# Patient Record
Sex: Female | Born: 1970
Health system: Southern US, Community
[De-identification: ages and names within clinical notes are randomized; demographics above are authoritative.]

## PROBLEM LIST (undated history)

## (undated) ENCOUNTER — Inpatient Hospital Stay (HOSPITAL_COMMUNITY): Payer: Self-pay

## (undated) DIAGNOSIS — K219 Gastro-esophageal reflux disease without esophagitis: Secondary | ICD-10-CM

## (undated) DIAGNOSIS — E669 Obesity, unspecified: Secondary | ICD-10-CM

## (undated) DIAGNOSIS — K649 Unspecified hemorrhoids: Secondary | ICD-10-CM

## (undated) DIAGNOSIS — I10 Essential (primary) hypertension: Secondary | ICD-10-CM

## (undated) DIAGNOSIS — L910 Hypertrophic scar: Secondary | ICD-10-CM

## (undated) DIAGNOSIS — A599 Trichomoniasis, unspecified: Secondary | ICD-10-CM

## (undated) DIAGNOSIS — IMO0001 Reserved for inherently not codable concepts without codable children: Secondary | ICD-10-CM

## (undated) HISTORY — PX: KELOID EXCISION: SHX1856

## (undated) HISTORY — DX: Reserved for inherently not codable concepts without codable children: IMO0001

## (undated) HISTORY — DX: Gastro-esophageal reflux disease without esophagitis: K21.9

## (undated) HISTORY — DX: Essential (primary) hypertension: I10

## (undated) HISTORY — DX: Hypertrophic scar: L91.0

## (undated) HISTORY — DX: Trichomoniasis, unspecified: A59.9

## (undated) HISTORY — DX: Unspecified hemorrhoids: K64.9

## (undated) HISTORY — DX: Obesity, unspecified: E66.9

---

## 2007-05-20 ENCOUNTER — Ambulatory Visit: Payer: Self-pay | Admitting: Obstetrics and Gynecology

## 2007-08-19 ENCOUNTER — Emergency Department (HOSPITAL_COMMUNITY): Admission: EM | Admit: 2007-08-19 | Discharge: 2007-08-19 | Payer: Self-pay | Admitting: Emergency Medicine

## 2007-08-28 ENCOUNTER — Emergency Department: Payer: Self-pay | Admitting: Emergency Medicine

## 2008-11-15 ENCOUNTER — Encounter: Payer: Self-pay | Admitting: Family Medicine

## 2008-11-15 ENCOUNTER — Ambulatory Visit: Payer: Self-pay | Admitting: Family Medicine

## 2008-11-22 ENCOUNTER — Ambulatory Visit: Payer: Self-pay | Admitting: Obstetrics & Gynecology

## 2009-12-05 ENCOUNTER — Ambulatory Visit: Payer: Self-pay | Admitting: Obstetrics and Gynecology

## 2010-04-26 ENCOUNTER — Ambulatory Visit (INDEPENDENT_AMBULATORY_CARE_PROVIDER_SITE_OTHER): Payer: 59 | Admitting: Obstetrics & Gynecology

## 2010-04-26 DIAGNOSIS — IMO0001 Reserved for inherently not codable concepts without codable children: Secondary | ICD-10-CM

## 2010-04-26 DIAGNOSIS — Z30430 Encounter for insertion of intrauterine contraceptive device: Secondary | ICD-10-CM

## 2010-04-26 HISTORY — DX: Reserved for inherently not codable concepts without codable children: IMO0001

## 2010-05-01 ENCOUNTER — Other Ambulatory Visit: Payer: Self-pay | Admitting: Family Medicine

## 2010-05-01 ENCOUNTER — Encounter: Payer: Self-pay | Admitting: Family Medicine

## 2010-05-01 ENCOUNTER — Ambulatory Visit (INDEPENDENT_AMBULATORY_CARE_PROVIDER_SITE_OTHER): Payer: 59 | Admitting: Family Medicine

## 2010-05-01 DIAGNOSIS — I10 Essential (primary) hypertension: Secondary | ICD-10-CM

## 2010-05-01 DIAGNOSIS — K219 Gastro-esophageal reflux disease without esophagitis: Secondary | ICD-10-CM | POA: Insufficient documentation

## 2010-05-01 LAB — CBC WITH DIFFERENTIAL/PLATELET
Basophils Relative: 0.3 % (ref 0.0–3.0)
Eosinophils Absolute: 0.3 10*3/uL (ref 0.0–0.7)
Eosinophils Relative: 3 % (ref 0.0–5.0)
HCT: 41.1 % (ref 36.0–46.0)
Hemoglobin: 13.5 g/dL (ref 12.0–15.0)
Lymphs Abs: 2.4 10*3/uL (ref 0.7–4.0)
MCHC: 32.9 g/dL (ref 30.0–36.0)
MCV: 72.2 fl — ABNORMAL LOW (ref 78.0–100.0)
Monocytes Absolute: 0.6 10*3/uL (ref 0.1–1.0)
Neutro Abs: 5.2 10*3/uL (ref 1.4–7.7)
Neutrophils Relative %: 61.6 % (ref 43.0–77.0)
RBC: 5.7 Mil/uL — ABNORMAL HIGH (ref 3.87–5.11)
WBC: 8.5 10*3/uL (ref 4.5–10.5)

## 2010-05-01 LAB — BASIC METABOLIC PANEL
CO2: 28 mEq/L (ref 19–32)
Calcium: 9.3 mg/dL (ref 8.4–10.5)
Chloride: 105 mEq/L (ref 96–112)
Glucose, Bld: 137 mg/dL — ABNORMAL HIGH (ref 70–99)
Potassium: 5 mEq/L (ref 3.5–5.1)
Sodium: 140 mEq/L (ref 135–145)

## 2010-05-09 NOTE — Assessment & Plan Note (Signed)
Summary: new patient/[facility] recommend/rb...   Vital Signs:  Patient profile:   40 year old female Height:      60 inches Weight:      203.50 pounds BMI:     39.89 Temp:     97.9 degrees F oral BP sitting:   150 / 110  (left arm) Cuff size:   large  Vitals Entered By: Linde Gillis CMA Duncan Dull) (May 01, 2010 2:02 PM) CC: new patient, establish care   History of Present Illness: 40 yo here to establish care.  HTN- has been told it is elevated for years. At [facility] on 04/26/2010, BP was 158/112.  150/110 in our office today. Never been treated for HTN. Strong FH of HTN. Admits to eating foods very high in sodium. Does want to loose weight.  Has a constant frontal headache. Sometimes has blurred vision, had a dilated eye exam last year.  No CP, SOB, or LE edema.  At times does have problems with acid reflux, can feel acid/food coming up when she is laying down.  Admits to smoking and eating high fat foods.  Preventive Screening-Counseling & Management  Alcohol-Tobacco     Smoking Status: current  Current Medications (verified): 1)  Quinapril Hcl 10 Mg  Tabs (Quinapril Hcl) .Marland Kitchen.. 1 By Mouth Daily 2)  Omeprazole 20 Mg Cpdr (Omeprazole) .... One By Mouth Daily  Allergies (verified): No Known Drug Allergies  Past History:  Family History: Last updated: 05/01/2010 Family History Hypertension  Social History: Last updated: 05/01/2010 Works as Energy manager Current Smoker Alcohol use-no  Risk Factors: Smoking Status: current (05/01/2010)  Past Medical History: Hypertension G3P2 IUD placed 04/26/2010 GERD  Past Surgical History: Denies surgical history  Past History:  Care Management: OB/Gyn: [facility]  Family History: Family History Hypertension  Social History: Works as Energy manager Current Smoker Alcohol use-no Smoking Status:  current  Review of Systems      See  HPI General:  Denies malaise. Eyes:  Complains of blurring. ENT:  Denies difficulty swallowing. CV:  Denies chest pain or discomfort. Resp:  Denies shortness of breath. Neuro:  Complains of headaches; denies tremors, visual disturbances, and weakness.  Physical Exam  General:  alert, well-developed, and overweight-appearing.   Head:  normocephalic, atraumatic, no abnormalities observed, and no abnormalities palpated.   Eyes:  vision grossly intact, pupils equal, pupils round, and pupils reactive to light.   Ears:  R ear normal and L ear normal.   Nose:  no external deformity.   Mouth:  good dentition.   Neck:  No deformities, masses, or tenderness noted. Lungs:  Normal respiratory effort, chest expands symmetrically. Lungs are clear to auscultation, no crackles or wheezes. Heart:  Normal rate and regular rhythm. S1 and S2 normal without gallop, murmur, click, rub or other extra sounds. Extremities:  no edema Neurologic:  alert & oriented X3 and gait normal.   Skin:  Intact without suspicious lesions or rashes Cervical Nodes:  No lymphadenopathy noted Psych:  Cognition and judgment appear intact. Alert and cooperative with normal attention span and concentration. No apparent delusions, illusions, hallucinations   Impression & Recommendations:  Problem # 1:  HYPERTENSION (ICD-401.9) Assessment New Discussed decreasing sodium in diet and increasing physical activity.   Will start Quinipril 10 mg daily, pt information handout for Quinipril given. Check BMET, TSH and CBC today. FOllow up in 1 month.  If remains high, consider renal ultrasound. Her  updated medication list for this problem includes:    Quinapril Hcl 10 Mg Tabs (Quinapril hcl) .Marland Kitchen... 1 by mouth daily  Orders: Venipuncture (04540) TLB-BMP (Basic Metabolic Panel-BMET) (80048-METABOL) TLB-TSH (Thyroid Stimulating Hormone) (84443-TSH) TLB-CBC Platelet - w/Differential (85025-CBCD)  Problem # 2:  GERD  (ICD-530.81) Assessment: New Discussed lifestyle modification- not eating late, cutting back on high fat foods and smoking cessation. Given Rx for Omeprazole. Her updated medication list for this problem includes:    Omeprazole 20 Mg Cpdr (Omeprazole) ..... One by mouth daily  Complete Medication List: 1)  Quinapril Hcl 10 Mg Tabs (Quinapril hcl) .Marland Kitchen.. 1 by mouth daily 2)  Omeprazole 20 Mg Cpdr (Omeprazole) .... One by mouth daily  Patient Instructions: 1)  Great to meet you. 2)  please make a follow up appointment in one month. Prescriptions: OMEPRAZOLE 20 MG CPDR (OMEPRAZOLE) one by mouth daily  #90 x 3   Entered and Authorized by:   Ruthe Mannan MD   Signed by:   Ruthe Mannan MD on 05/01/2010   Method used:   Print then Give to Patient   RxID:   [id number] QUINAPRIL HCL 10 MG  TABS (QUINAPRIL HCL) 1 by mouth daily  #90 x 1   Entered and Authorized by:   Ruthe Mannan MD   Signed by:   Ruthe Mannan MD on 05/01/2010   Method used:   Print then Give to Patient   RxID:   [id number]    Orders Added: 1)  Venipuncture [13244] 2)  TLB-BMP (Basic Metabolic Panel-BMET) [80048-METABOL] 3)  TLB-TSH (Thyroid Stimulating Hormone) [84443-TSH] 4)  TLB-CBC Platelet - w/Differential [85025-CBCD] 5)  New Patient Level III [99203]    Prior Medications (reviewed today): None Current Allergies (reviewed today): No known allergies      Past Medical History:    Hypertension    G3P2    IUD placed 04/26/2010    GERD  Past Surgical History:    Denies surgical history

## 2010-05-30 ENCOUNTER — Ambulatory Visit: Payer: 59 | Admitting: Obstetrics and Gynecology

## 2010-06-21 ENCOUNTER — Ambulatory Visit: Payer: 59 | Admitting: Obstetrics & Gynecology

## 2010-07-05 ENCOUNTER — Ambulatory Visit: Payer: 59 | Admitting: Family Medicine

## 2010-07-11 NOTE — Assessment & Plan Note (Signed)
Brittany Grant, PARISH NO.:  [id number]   MEDICAL RECORD NO.:  [id number]          PATIENT TYPE:  POB   LOCATION:  [facility]         FACILITY:  [facility]   PHYSICIAN:  Argentina Donovan, MD        DATE OF BIRTH:  02-04-1971   DATE OF SERVICE:  11/22/2008                                  CLINIC NOTE   The patient is a 40 year old gravida 3, para 2-0-1-2 who was in recently  for her routine exam on November 16, 2008, decided to have her IUD  removed as she wanted to conceive.  She came in today and we easily  removed her Mirena without incident.  Also discussed with her that she  wait 2 cycles to try and conceive and that she start immediately on  prenatal vitamins with folic acid.  In addition to this, while removing  it we noticed that the patient had a 4 x 3 x 3 pedunculated lipoma on  her right buttocks in the anterior aspect, pendulous, and certainly  bothersome.  She has had to get that removed because of formation of  keloids that she has had in the past.  I have told her that they could  prevent the keloids and that she should have it removed, that she  thought it was a keloid and I told her it was probably a lipoma and it  is a tumor.  We are referring her to a dermatologist, specifically  because they tend to have all the instruments for office removal,  although the base does measure about 4 cm x 0.5 cm in length and width  and will probably require several stitches afterwards.   Impression is successful removal of the intrauterine device and refer to  dermatologist for lipoma removal.           ______________________________  Argentina Donovan, MD     PR/MEDQ  D:  11/22/2008  T:  11/23/2008  Job:  [id number]

## 2010-07-11 NOTE — Assessment & Plan Note (Signed)
Brittany Grant, Brittany Grant NO.:  [id number]   MEDICAL RECORD NO.:  [id number]          PATIENT TYPE:  POB   LOCATION:  [facility]         FACILITY:  [facility]   PHYSICIAN:  Tinnie Gens, MD        DATE OF BIRTH:  1970/10/01   DATE OF SERVICE:                                  CLINIC NOTE   HISTORY OF PRESENT ILLNESS:  The patient is a 40 year old gravida 3,  para 2-0-1-2 who comes in today for an annual exam.  She is without  significant complaint.  She has a Mirena IUD in for the last several  years and has not had a cycle since that was put in.  Before that she  had a copper T IUD which gave her very heavy cycles.  She is interested  in having another baby and she wants to have a girl if possible.  The  patient reports a remote history of hypertension diagnosed at some  medical clinic; however, she is on no medication and blood pressure is  fine today.   PAST MEDICAL HISTORY:  Significant for hypertension.   PAST SURGICAL HISTORY:  She has had keloids removed from her ears.   FAMILY HISTORY:  Diabetes and hypertension.   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  She is a smoker, approximately quarter pack per day  for the last 10 years.  No alcohol.  No other drug use.   OBSTETRICAL HISTORY:  G3, P2, two vaginal deliveries and one  termination.   GYNECOLOGIC HISTORY:  Menarche at age 47.  Cycles were monthly.  She has  a Mirena IUD, which means no cycle for her.  She does have history of  abnormal Pap in 2001.  Her last Pap was in 2008.   REVIEW OF SYSTEMS:  A 14-point review of systems is reviewed.  Please  see GYN history in the chart, which is positive for fatigue, headache,  and vaginal odor.   PHYSICAL EXAMINATION:  GENERAL:  Today, the patient is an obese female  in no acute distress.  VITALS:  Blood pressures 92/66 and pulse is 51, weight is 207.  HEENT:  Normocephalic, atraumatic.  Sclerae  anicteric.  NECK:  Supple.  Normal thyroid.  LUNGS:  Clear bilaterally.  CV:  Regular rate and rhythm without rubs, gallops, murmurs.  ABDOMEN:  Soft, nontender, nondistended.  SKIN:  Multiple keloid formations on it, most notably on her back, she  has some on her chest, and one on her buttocks.  BREASTS:  Symmetric with everted nipples.  No definitive mass.  She has  bilateral fibrocystic change noted.  No supraclavicular or axillary  adenopathy.  GU:  Normal external female genitalia.  BUS is normal.  Vagina is pink  and rugated.  Cervix is parous without lesions.  IUD strings are  visualized.  Uterus is small, anteverted.  No adnexal mass or  tenderness, although exam is limited by body habitus.  EXTREMITIES:  No cyanosis, clubbing, or edema.   IMPRESSION:  1. Yearly exam with Pap smear.  2. Interested in IUD removal, we will perform next week.  3. Interested in  pregnancy, should not start prenatal vitamins with      folic acid once she gets her IUD taken out.  4. I see no evidence of hypertension today.           ______________________________  Tinnie Gens, MD     TP/MEDQ  D:  11/15/2008  T:  11/16/2008  Job:  [id number]

## 2010-09-06 ENCOUNTER — Other Ambulatory Visit (HOSPITAL_COMMUNITY)
Admission: RE | Admit: 2010-09-06 | Discharge: 2010-09-06 | Disposition: A | Discharge status: Home / Self Care | Payer: BC Managed Care – PPO | Source: Ambulatory Visit | Attending: Family Medicine | Admitting: Family Medicine

## 2010-09-06 ENCOUNTER — Encounter: Payer: Self-pay | Admitting: Family Medicine

## 2010-09-06 ENCOUNTER — Ambulatory Visit (INDEPENDENT_AMBULATORY_CARE_PROVIDER_SITE_OTHER): Payer: BC Managed Care – PPO | Admitting: Family Medicine

## 2010-09-06 ENCOUNTER — Ambulatory Visit (INDEPENDENT_AMBULATORY_CARE_PROVIDER_SITE_OTHER): Payer: 59 | Admitting: Family Medicine

## 2010-09-06 VITALS — BP 160/100 | Temp 97.8°F | Wt 199.2 lb

## 2010-09-06 VITALS — BP 157/123 | HR 75 | Ht 60.0 in | Wt 201.0 lb

## 2010-09-06 DIAGNOSIS — I10 Essential (primary) hypertension: Secondary | ICD-10-CM

## 2010-09-06 DIAGNOSIS — Z01419 Encounter for gynecological examination (general) (routine) without abnormal findings: Secondary | ICD-10-CM | POA: Insufficient documentation

## 2010-09-06 MED ORDER — LISINOPRIL-HYDROCHLOROTHIAZIDE 20-12.5 MG PO TABS: 1.0000 | ORAL_TABLET | Freq: Every day | ORAL | Status: DC

## 2010-09-06 NOTE — Progress Notes (Signed)
40 yo here to follow HTN.  HTN-   Started her on Quinipril 10 mg daily in March, pt lost to follow up. Was at [facility] office recently and it was elevated again in 160s/100. Elevated here again today. Says she is taking her medication regularly. No noticeable side effects from the medication.  She is having headaches lately and "fuzzy vision." No muscle weakness or other focal neurological deficits.   Strong FH of HTN. Admits to eating foods very high in sodium. Trying to loose weight.  Wt Readings from Last 3 Encounters:  09/06/10 199 lb 4 oz (90.379 kg)  09/06/10 201 lb (91.173 kg)  05/01/10 203 lb 8 oz (92.307 kg)      Patient Active Problem List  Diagnoses  . HYPERTENSION  . GERD   Past Medical History  Diagnosis Date  . Hypertension   . GERD (gastroesophageal reflux disease)   . IUD 04/26/2010  . Obesity   . Scarring, keloid    No past surgical history on file. History  Substance Use Topics  . Smoking status: Current Everyday Smoker -- 0.2 packs/day for 10 years    Types: Cigarettes  . Smokeless tobacco: Never Used   Comment: smoking 1/4 pack per day  . Alcohol Use: No   Family History  Problem Relation Age of Onset  . Hypertension Other   . Diabetes Mother   . Diabetes Father   . Hypertension Mother   . Hypertension Father   . Hypertension Father    No Known Allergies Current Outpatient Prescriptions on File Prior to Visit  Medication Sig Dispense Refill  . levonorgestrel (MIRENA) 20 MCG/24HR IUD 1 each by Intrauterine route once.          The PMH, PSH, Social History, Family History, Medications, and allergies have been reviewed in [facility], and have been updated if relevant.  Review of Systems       See HPI General:  Denies malaise. Eyes:  Complains of blurring. ENT:  Denies difficulty swallowing. CV:  Denies chest pain or discomfort. Resp:  Denies shortness of breath. Neuro:  Complains of headaches; denies tremors, visual disturbances, and  weakness.  Physical Exam BP 160/100  Temp(Src) 97.8 F (36.6 C) (Oral)  Wt 199 lb 4 oz (90.379 kg)  General:  alert, well-developed, and overweight-appearing.   Head:  normocephalic, atraumatic, no abnormalities observed, and no abnormalities palpated.   Eyes:  vision grossly intact, pupils equal, pupils round, and pupils reactive to light.   Ears:  R ear normal and L ear normal.   Nose:  no external deformity.   Mouth:  good dentition.   Neck:  No deformities, masses, or tenderness noted. Lungs:  Normal respiratory effort, chest expands symmetrically. Lungs are clear to auscultation, no crackles or wheezes. Heart:  Normal rate and regular rhythm. S1 and S2 normal without gallop, murmur, click, rub or other extra sounds. Extremities:  no edema Neurologic:  alert & oriented X3 and gait normal.   Skin:  Intact without suspicious lesions or rashes Cervical Nodes:  No lymphadenopathy noted Psych:  Cognition and judgment appear intact. Alert and cooperative with normal attention span and concentration. No apparent delusions, illusions, hallucinations   Impression & Recommendations: 1. HYPERTENSION    Deteriorated. Will increase BP medication- stop Quinipril, start Lisinopril-HCTZ 20-12.5 mg. Pt needs to be seen in 2 weeks. Stressed importance of follow up. The patient indicates understanding of these issues and agrees with the plan. See pt instructions for details.

## 2010-09-06 NOTE — Progress Notes (Signed)
  Subjective:     Brittany Grant is a 40 y.o. female here for a routine exam.  Current complaints: None, except for headache.  Will see her primary care today.  Has IUD in and is happy with it, but is considering having another child.  Her current partner is in a relationship with someone else.    Gynecologic History No LMP recorded. Patient is not currently having periods (Reason: IUD). Contraception: IUD Last Pap: 10/2008. Results were: normal Last mammogram: none.  Obstetric History OB History    Grav Para Term Preterm Abortions TAB SAB Ect Mult Living   3 2 2  0 0 0 0 0 0 2     # Outc Date GA Lbr Len/2nd Wgt Sex Del Anes PTL Lv   1 GRA            2 TRM            3 TRM                The following portions of the patient's history were reviewed and updated as appropriate: allergies, current medications, past family history, past medical history, past social history, past surgical history and problem list.  Review of Systems A comprehensive review of systems was negative.    Objective:    General appearance: alert, cooperative and appears stated age Head: Normocephalic, without obvious abnormality, atraumatic Neck: no adenopathy, no carotid bruit, no JVD, supple, symmetrical, trachea midline and thyroid not enlarged, symmetric, no tenderness/mass/nodules Lungs: clear to auscultation bilaterally Breasts: normal appearance, no masses or tenderness, No nipple retraction or dimpling Heart: regular rate and rhythm, S1, S2 normal, no murmur, click, rub or gallop Abdomen: soft, non-tender; bowel sounds normal; no masses,  no organomegaly Pelvic: cervix normal in appearance, external genitalia normal, no adnexal masses or tenderness, no cervical motion tenderness, rectovaginal septum normal, uterus normal size, shape, and consistency and vagina normal without discharge Extremities: extremities normal, atraumatic, no cyanosis or edema Pulses: 2+ and symmetric Skin: Skin color,  texture, turgor normal. No rashes or lesions Lymph nodes: Cervical, supraclavicular, and axillary nodes normal.    Assessment:    Healthy female exam.    Plan:    Pap smear today F/u as needed.

## 2010-09-06 NOTE — Patient Instructions (Signed)
Please stop taking your Quinipril. Start taking your lisinopril-HCTZ. Make appt to come see me in two weeks.

## 2010-09-08 NOTE — Assessment & Plan Note (Signed)
NAMEGAYLIN, OSORIA NO.:  [id number]  MEDICAL RECORD NO.:  [id number]          PATIENT TYPE:  POB  LOCATION:  [facility]         FACILITY:  [facility]  PHYSICIAN:  Tinnie Gens, MD        DATE OF BIRTH:  17-Mar-1970  DATE OF SERVICE:  04/26/2010                                 CLINIC NOTE  CHIEF COMPLAINT:  IUD insertion.  HISTORY OF PRESENT ILLNESS:  The patient is a 40 year old gravida 3, para 2-0-1-2, who had her last IUD removed in September 2010.  She now desires to have her Mirena IUD put back in.  She has not had a Pap since September 2010.  Her blood pressure is markedly elevated today, 158/113, which she says it as always is.  She is presently sexually active and not interested in having a baby at this time.  PHYSICAL EXAMINATION:  VITAL SIGNS:  Her vitals are as noted in the chart.  Blood pressure again 158/113, weight 202. GENERAL:  She is a well developed, well nourished, in no acute stress. GU:  Normal external female genitalia.  BUS is normal. Vagina is pink and rugated.  The patient is presently on her cycle.  Cervix is parous without lesion.  PROCEDURE:  Cervix was cleaned with Betadine x2.  Cervix was grasped with single-tooth tenaculum, sounds 6 cm.  Mirena IUD was placed without difficulty.  Strings were trimmed to 2 cm.  The patient is exquisitely tender on exam with just insertion of the speculum, she crawls up the table with hand, however, we were able to get the IUD without difficulty.  IMPRESSION: 1. Undesired fertility status post Mirena intrauterine device     insertion. 2. Hypertension, poorly controlled.  PLAN: 1. Return in 4 weeks for IUD check and Pap smear. 2. We will refer the patient to PCP for blood pressure control.          ______________________________ Tinnie Gens, MD    TP/MEDQ  D:  04/26/2010  T:  04/26/2010  Job:  [id number]

## 2010-09-20 ENCOUNTER — Ambulatory Visit: Payer: BC Managed Care – PPO | Admitting: Family Medicine

## 2010-09-27 ENCOUNTER — Ambulatory Visit: Payer: BC Managed Care – PPO | Admitting: Family Medicine

## 2010-10-02 ENCOUNTER — Ambulatory Visit (INDEPENDENT_AMBULATORY_CARE_PROVIDER_SITE_OTHER): Payer: BC Managed Care – PPO | Admitting: Family Medicine

## 2010-10-02 ENCOUNTER — Encounter: Payer: Self-pay | Admitting: Family Medicine

## 2010-10-02 VITALS — BP 130/90 | HR 64 | Temp 97.8°F | Wt 196.5 lb

## 2010-10-02 DIAGNOSIS — I10 Essential (primary) hypertension: Secondary | ICD-10-CM

## 2010-10-02 NOTE — Progress Notes (Signed)
40 yo here to follow HTN.  HTN-   Started her on Quinipril 10 mg daily in March, pt lost to follow up. Was at [facility] office last month and BP elevated again, started HCTZ- Lisinopril.  Here for follow up.   Headaches and "fuzzy feeling" have disappeared.  Has more energy as well. . No noticeable side effects from the medication.  Strong FH of HTN. Admits to eating foods very high in sodium. Trying to loose weight.  Wt Readings from Last 3 Encounters:  10/02/10 196 lb 8 oz (89.132 kg)  09/06/10 199 lb 4 oz (90.379 kg)  09/06/10 201 lb (91.173 kg)     Patient Active Problem List  Diagnoses  . HYPERTENSION  . GERD   Past Medical History  Diagnosis Date  . Hypertension   . GERD (gastroesophageal reflux disease)   . IUD 04/26/2010  . Obesity   . Scarring, keloid    No past surgical history on file. History  Substance Use Topics  . Smoking status: Current Everyday Smoker -- 0.2 packs/day for 10 years    Types: Cigarettes  . Smokeless tobacco: Never Used   Comment: smoking 1/4 pack per day  . Alcohol Use: No   Family History  Problem Relation Age of Onset  . Hypertension Other   . Diabetes Mother   . Diabetes Father   . Hypertension Mother   . Hypertension Father   . Hypertension Father    No Known Allergies Current Outpatient Prescriptions on File Prior to Visit  Medication Sig Dispense Refill  . levonorgestrel (MIRENA) 20 MCG/24HR IUD 1 each by Intrauterine route once.        Marland Kitchen lisinopril-hydrochlorothiazide (PRINZIDE,ZESTORETIC) 20-12.5 MG per tablet Take 1 tablet by mouth daily.  30 tablet  3  . omeprazole (PRILOSEC) 20 MG capsule Take 20 mg by mouth daily.          The PMH, PSH, Social History, Family History, Medications, and allergies have been reviewed in [facility], and have been updated if relevant.  Review of Systems       See HPI  Physical Exam BP 130/90  Pulse 64  Temp(Src) 97.8 F (36.6 C) (Oral)  Wt 196 lb 8 oz (89.132 kg)  General:  alert,  well-developed, and overweight-appearing.   Head:  normocephalic, atraumatic, no abnormalities observed, and no abnormalities palpated.   Eyes:  vision grossly intact, pupils equal, pupils round, and pupils reactive to light.   Ears:  R ear normal and L ear normal.   Nose:  no external deformity.   Mouth:  good dentition.   Neck:  No deformities, masses, or tenderness noted. Lungs:  Normal respiratory effort, chest expands symmetrically. Lungs are clear to auscultation, no crackles or wheezes. Heart:  Normal rate and regular rhythm. S1 and S2 normal without gallop, murmur, click, rub or other extra sounds. Extremities:  no edema Neurologic:  alert & oriented X3 and gait normal.   Skin:  Intact without suspicious lesions or rashes Cervical Nodes:  No lymphadenopathy noted Psych:  Cognition and judgment appear intact. Alert and cooperative with normal attention span and concentration. No apparent delusions, illusions, hallucinations   Impression & Recommendations: 1. HYPERTENSION    Much improved.   Continue current dose of HCTZ-Lisinopril. Discussed low salt diet and exercise. The patient indicates understanding of these issues and agrees with the plan.

## 2010-11-20 ENCOUNTER — Telehealth: Payer: Self-pay | Admitting: *Deleted

## 2010-11-20 NOTE — Telephone Encounter (Signed)
Pt states that for 2 weeks her right hand has been twitching, sometimes she's unable to hold a glass.  She says this has been constant.  Now the area under her right eye is twitching, constantly.  She is asking what this could be, advised her she needs an office visit to determine. She doesn't want to come in if she doesn't have to but I told her she cant be diagnosed over the phone.  Please advise.

## 2010-11-20 NOTE — Telephone Encounter (Signed)
Patient advised as instructed via telephone.  Appt  Scheduled for 11/21/2010 at 9:45.

## 2010-11-20 NOTE — Telephone Encounter (Signed)
Needs to be seen

## 2010-11-21 ENCOUNTER — Ambulatory Visit (INDEPENDENT_AMBULATORY_CARE_PROVIDER_SITE_OTHER): Payer: BC Managed Care – PPO | Admitting: Family Medicine

## 2010-11-21 ENCOUNTER — Encounter: Payer: Self-pay | Admitting: Family Medicine

## 2010-11-21 ENCOUNTER — Encounter: Payer: Self-pay | Admitting: Neurology

## 2010-11-21 VITALS — BP 122/86 | HR 68 | Temp 98.1°F | Wt 192.8 lb

## 2010-11-21 DIAGNOSIS — R253 Fasciculation: Secondary | ICD-10-CM

## 2010-11-21 DIAGNOSIS — R259 Unspecified abnormal involuntary movements: Secondary | ICD-10-CM

## 2010-11-21 DIAGNOSIS — R51 Headache: Secondary | ICD-10-CM | POA: Insufficient documentation

## 2010-11-21 DIAGNOSIS — R519 Headache, unspecified: Secondary | ICD-10-CM | POA: Insufficient documentation

## 2010-11-21 LAB — BASIC METABOLIC PANEL
BUN: 14 mg/dL (ref 6–23)
CO2: 27 mEq/L (ref 19–32)
Calcium: 9.5 mg/dL (ref 8.4–10.5)
GFR: 89.09 mL/min (ref >60.00)
Glucose, Bld: 115 mg/dL — ABNORMAL HIGH (ref 70–99)
Sodium: 142 mEq/L (ref 135–145)

## 2010-11-21 LAB — CBC WITH DIFFERENTIAL/PLATELET
Basophils Absolute: 0 10*3/uL (ref 0.0–0.1)
Eosinophils Absolute: 0.1 10*3/uL (ref 0.0–0.7)
HCT: 43.8 % (ref 36.0–46.0)
Hemoglobin: 13.9 g/dL (ref 12.0–15.0)
Lymphocytes Relative: 31.3 % (ref 12.0–46.0)
Lymphs Abs: 2.4 10*3/uL (ref 0.7–4.0)
MCHC: 31.8 g/dL (ref 30.0–36.0)
Neutro Abs: 4.4 10*3/uL (ref 1.4–7.7)
Platelets: 265 10*3/uL (ref 150.0–400.0)
RDW: 16.1 % — ABNORMAL HIGH (ref 11.5–14.6)

## 2010-11-21 LAB — VITAMIN B12: Vitamin B-12: 135 pg/mL — ABNORMAL LOW (ref 211–911)

## 2010-11-21 NOTE — Progress Notes (Signed)
40 yo here for headache and muscle twitching x 2 weeks.    PMH significant for HTN. On HCTZ- Lisinopril for HTN and BP has been well controlled.    Strong FH of HTN. Admits to eating foods very high in sodium. Trying to loose weight.  Wt Readings from Last 3 Encounters:  11/21/10 192 lb 12 oz (87.431 kg)  10/02/10 196 lb 8 oz (89.132 kg)  09/06/10 199 lb 4 oz (90.379 kg)   Past two weeks, wakes up with frontal headache almost daily. No nausea, vomiting, photophobia but does complain of phonophobia. Has also noticed some intermittent right third digit and right facial twitching.  Does not drink much caffeine. No new stressors at work, works on a [employer].  Patient Active Problem List  Diagnoses  . HYPERTENSION  . GERD  . Headache  . Muscle twitching   Past Medical History  Diagnosis Date  . Hypertension   . GERD (gastroesophageal reflux disease)   . IUD 04/26/2010  . Obesity   . Scarring, keloid    No past surgical history on file. History  Substance Use Topics  . Smoking status: Former Smoker -- 0.2 packs/day for 10 years    Types: Cigarettes  . Smokeless tobacco: Never Used   Comment: smoking 1/4 pack per day  . Alcohol Use: No   Family History  Problem Relation Age of Onset  . Hypertension Other   . Diabetes Mother   . Diabetes Father   . Hypertension Mother   . Hypertension Father   . Hypertension Father    No Known Allergies Current Outpatient Prescriptions on File Prior to Visit  Medication Sig Dispense Refill  . levonorgestrel (MIRENA) 20 MCG/24HR IUD 1 each by Intrauterine route once.        Marland Kitchen lisinopril-hydrochlorothiazide (PRINZIDE,ZESTORETIC) 20-12.5 MG per tablet Take 1 tablet by mouth daily.  30 tablet  3  . omeprazole (PRILOSEC) 20 MG capsule Take 20 mg by mouth daily.          The PMH, PSH, Social History, Family History, Medications, and allergies have been reviewed in [facility], and have been updated if relevant.  Review of Systems       See  HPI  Physical Exam BP 122/86  Pulse 68  Temp(Src) 98.1 F (36.7 C) (Oral)  Wt 192 lb 12 oz (87.431 kg)  General:  alert, well-developed, and overweight-appearing.   Head:  normocephalic, atraumatic, no abnormalities observed, and no abnormalities palpated.   Eyes:  vision grossly intact, pupils equal, pupils round, and pupils reactive to light.   Ears:  R ear normal and L ear normal.   Nose:  no external deformity.   Mouth:  good dentition.   Lungs:  Normal respiratory effort, chest expands symmetrically. Lungs are clear to auscultation, no crackles or wheezes. Heart:  Normal rate and regular rhythm. S1 and S2 normal without gallop, murmur, click, rub or other extra sounds. Extremities:  no edema Neurologic:  alert & oriented X3 and gait normal, CNII-XII intact.   Skin:  Intact without suspicious lesions or rashes Psych:  Cognition and judgment appear intact. Alert and cooperative with normal attention span and concentration. No apparent delusions, illusions, hallucinations   Impression & Recommendations: 1. Headache  New ?tension vs migraine variant. Will refer to neuro given associated symptoms. Ambulatory referral to Neurology  2. Muscle twitching   New See above.  Will check labs today to rule out electrolyte imbalance or other abnormalities. Refer to neuro. TSH, Basic Metabolic  Panel (BMET), CBC w/Diff, Ambulatory referral to Neurology

## 2010-11-21 NOTE — Patient Instructions (Signed)
Good to see you. Please stop by to see Marion on your way out. 

## 2010-11-23 LAB — POCT I-STAT, CHEM 8
Calcium, Ion: 1.18
Glucose, Bld: 112 — ABNORMAL HIGH
HCT: 46
Hemoglobin: 15.6 — ABNORMAL HIGH

## 2010-11-29 ENCOUNTER — Ambulatory Visit: Payer: BC Managed Care – PPO | Admitting: Neurology

## 2010-12-01 ENCOUNTER — Ambulatory Visit: Payer: BC Managed Care – PPO | Admitting: Neurology

## 2010-12-12 ENCOUNTER — Ambulatory Visit: Payer: BC Managed Care – PPO | Admitting: Neurology

## 2011-01-17 ENCOUNTER — Other Ambulatory Visit: Payer: Self-pay | Admitting: Family Medicine

## 2011-03-28 ENCOUNTER — Emergency Department: Payer: Self-pay | Admitting: Emergency Medicine

## 2011-05-08 ENCOUNTER — Encounter: Payer: Self-pay | Admitting: Family Medicine

## 2011-05-08 ENCOUNTER — Ambulatory Visit (INDEPENDENT_AMBULATORY_CARE_PROVIDER_SITE_OTHER): Payer: BC Managed Care – PPO | Admitting: Family Medicine

## 2011-05-08 VITALS — BP 146/116 | HR 86 | Ht 60.0 in | Wt 192.0 lb

## 2011-05-08 DIAGNOSIS — Z30432 Encounter for removal of intrauterine contraceptive device: Secondary | ICD-10-CM

## 2011-05-08 DIAGNOSIS — I10 Essential (primary) hypertension: Secondary | ICD-10-CM

## 2011-05-08 MED ORDER — PRENATABS FA PO TABS: 1.0000 | ORAL_TABLET | Freq: Every day | ORAL | Status: DC

## 2011-05-08 MED ORDER — HYDROCHLOROTHIAZIDE 25 MG PO TABS: 25.0000 mg | ORAL_TABLET | Freq: Every day | ORAL | Status: DC

## 2011-05-08 NOTE — Patient Instructions (Signed)
Preparing for Pregnancy Preparing for pregnancy (preconceptual care) by getting counseling and information from your caregiver before getting pregnant is a good idea. It will help you and your baby have a better chance to have a healthy, safe pregnancy and delivery of your baby. Make an appointment with your caregiver to talk about your health, medical, and family history and how to prepare yourself before getting pregnant. Your caregiver will do a complete physical exam and a Pap test. They will want to know:  About you, your spouse or partner, and your family's medical and genetic history.   If you are eating a balanced diet and drinking enough fluids.   What vitamins and mineral supplements you are taking. This includes taking folic acid before getting pregnant to help prevent birth defects.   What medications you are taking including prescription, over-the-counter and herbal medications.   If there is any substance abuse like alcohol, smoking, and illegal drugs.   If there is any mental or physical domestic violence.   If there is any risk of sexually transmitted disease between you and your partner.   What immunizations and vaccinations you have had and what you may need before getting pregnant.   If you should get tested for HIV infection.   If there is any exposure to chemical or toxic substances at home or work.   If there are medical problems you have that need to be treated and kept under control before getting pregnant such as diabetes, high blood pressure or others.   If there were any past surgeries, pregnancies and problems with them.   What your current weight is and to set a goal as to how much weight you should gain while pregnant. Also, they will check if you should lose or gain weight before getting pregnant.   What is your exercise routine and what it is safe when you are pregnant.   If there are any physical disabilities that need to be addressed.   About spacing  your pregnancies when there are other children.   If there is a financial problem that may affect you having a child.  After talking about the above points with your caregiver, your caregiver will give you advice on how to help treat and work with you on solving any issues, if necessary, before getting pregnant. The goal is to have a healthy and safe pregnancy for you and your baby. You should keep an accurate record of your menstrual periods because it will help in determining your due date. Immunizations that you should have before getting pregnant:   Regular measles, German measles (rubella) and mumps.   Tetanus and diphtheria.   Chickenpox, if not immune.   Herpes zoster (Varicella) if not immune.   Human papilloma virus vaccine (HPV) between the age of 9 and 26 years old.   Hepatitis A vaccine.   Hepatitis B vaccine.   Influenza vaccine.   Pneumococcal vaccine (pneumonia).  You should avoid getting pregnant for one month after getting vaccinated with a live virus vaccine such as German measles (rubella) vaccine. Other immunizations may be necessary depending on where you live, such as malaria. Ask your caregiver if any other immunizations are needed for you. HOME CARE INSTRUCTIONS   Follow the advice of your caregiver.   Before getting pregnant:   Begin taking vitamins, supplements, and 0.4 milligrams folic acid daily.   Get your immunizations up-to-date.   Get help from a nutrition counselor if you do not understand what   a balanced diet is, need help with a special medical diet or if you need help to lose or gain weight.   Begin exercising.   Stop smoking, taking illegal drugs, and drinking alcoholic beverages.   Get counseling if there is and type of domestic violence.   Get checked for sexually transmitted diseases including HIV.   Get any medical problems under control (diabetes, high blood pressure, convulsions, asthma or others).   Resolve any financial  concerns.   Be sure you and your spouse or partner are ready to have a baby.   Keep an accurate record of your menstrual periods.  Document Released: 01/26/2008 Document Revised: 02/01/2011 Document Reviewed: 01/26/2008 [employer] Patient Information 2012 [employer]. 

## 2011-05-08 NOTE — Progress Notes (Signed)
Patient is here today to have her IUD removed.  She is considering having a baby.

## 2011-05-08 NOTE — Progress Notes (Signed)
Here for IUD removal. Desires pregnancy.  Pre-conception counseling done.  Discussed vaginal health and douching, lubrication.  Filed Vitals:   05/08/11 1516  BP: 146/116  Pulse: 86   General:  WD/WN female in NAD Abd: soft, NT  Procedure: Speculum placed inside vagina, IUD strings visualized and grasped and removed easily.  1. Encounter for IUD removal    2. Hypertension  hydrochlorothiazide (HYDRODIURIL) 25 MG tablet   Must change from lisinopril to HCTZ alone, start pnv's prior to conception Timing of intercourse explained.

## 2011-05-29 ENCOUNTER — Other Ambulatory Visit: Payer: Self-pay | Admitting: Gynecology

## 2011-05-29 DIAGNOSIS — Z309 Encounter for contraceptive management, unspecified: Secondary | ICD-10-CM

## 2011-05-29 MED ORDER — PRENATABS FA PO TABS: 1.0000 | ORAL_TABLET | Freq: Every day | ORAL | Status: DC

## 2011-12-17 ENCOUNTER — Ambulatory Visit (INDEPENDENT_AMBULATORY_CARE_PROVIDER_SITE_OTHER): Payer: BC Managed Care – PPO | Admitting: *Deleted

## 2011-12-17 VITALS — BP 154/104 | Wt 196.0 lb

## 2011-12-17 DIAGNOSIS — O09529 Supervision of elderly multigravida, unspecified trimester: Secondary | ICD-10-CM

## 2011-12-17 DIAGNOSIS — N390 Urinary tract infection, site not specified: Secondary | ICD-10-CM

## 2011-12-17 DIAGNOSIS — IMO0002 Reserved for concepts with insufficient information to code with codable children: Secondary | ICD-10-CM

## 2011-12-17 DIAGNOSIS — O2390 Unspecified genitourinary tract infection in pregnancy, unspecified trimester: Secondary | ICD-10-CM

## 2011-12-17 DIAGNOSIS — Z3480 Encounter for supervision of other normal pregnancy, unspecified trimester: Secondary | ICD-10-CM

## 2011-12-17 DIAGNOSIS — O2341 Unspecified infection of urinary tract in pregnancy, first trimester: Secondary | ICD-10-CM

## 2011-12-17 NOTE — Progress Notes (Signed)
Patient is doing well, she is nervous to be pregnant again.  I have spoke with her about a referral to [facility] regarding her ama and she is in agreement.  Otherwise she is doing well, labs drawn today and discussed the need to change her BP medication, she will follow up with [place] this week to discuss these needed referrals and changes.

## 2011-12-18 LAB — OBSTETRIC PANEL
Antibody Screen: NEGATIVE
Basophils Absolute: 0 10*3/uL (ref 0.0–0.1)
Basophils Relative: 0 % (ref 0–1)
Hepatitis B Surface Ag: NEGATIVE
Lymphocytes Relative: 35 % (ref 12–46)
MCHC: 32.5 g/dL (ref 30.0–36.0)
Neutro Abs: 4.1 10*3/uL (ref 1.7–7.7)
Neutrophils Relative %: 54 % (ref 43–77)
Platelets: 276 10*3/uL (ref 150–400)
RDW: 16.3 % — ABNORMAL HIGH (ref 11.5–15.5)
WBC: 7.7 10*3/uL (ref 4.0–10.5)

## 2011-12-19 LAB — HEMOGLOBINOPATHY EVALUATION
Hgb A2 Quant: 2.5 % (ref 2.2–3.2)
Hgb A: 97.5 % (ref 96.8–97.8)
Hgb F Quant: 0 % (ref 0.0–2.0)

## 2011-12-20 ENCOUNTER — Ambulatory Visit (INDEPENDENT_AMBULATORY_CARE_PROVIDER_SITE_OTHER): Payer: BC Managed Care – PPO | Admitting: Obstetrics & Gynecology

## 2011-12-20 ENCOUNTER — Telehealth: Payer: Self-pay | Admitting: Obstetrics & Gynecology

## 2011-12-20 ENCOUNTER — Emergency Department: Payer: Self-pay | Admitting: Emergency Medicine

## 2011-12-20 VITALS — BP 137/102 | Wt 197.0 lb

## 2011-12-20 DIAGNOSIS — Z23 Encounter for immunization: Secondary | ICD-10-CM

## 2011-12-20 DIAGNOSIS — O10919 Unspecified pre-existing hypertension complicating pregnancy, unspecified trimester: Secondary | ICD-10-CM

## 2011-12-20 DIAGNOSIS — Z124 Encounter for screening for malignant neoplasm of cervix: Secondary | ICD-10-CM

## 2011-12-20 DIAGNOSIS — Z113 Encounter for screening for infections with a predominantly sexual mode of transmission: Secondary | ICD-10-CM

## 2011-12-20 DIAGNOSIS — O09899 Supervision of other high risk pregnancies, unspecified trimester: Secondary | ICD-10-CM

## 2011-12-20 DIAGNOSIS — I10 Essential (primary) hypertension: Secondary | ICD-10-CM

## 2011-12-20 DIAGNOSIS — O99330 Smoking (tobacco) complicating pregnancy, unspecified trimester: Secondary | ICD-10-CM

## 2011-12-20 DIAGNOSIS — Z3480 Encounter for supervision of other normal pregnancy, unspecified trimester: Secondary | ICD-10-CM

## 2011-12-20 DIAGNOSIS — O10019 Pre-existing essential hypertension complicating pregnancy, unspecified trimester: Secondary | ICD-10-CM

## 2011-12-20 DIAGNOSIS — IMO0002 Reserved for concepts with insufficient information to code with codable children: Secondary | ICD-10-CM

## 2011-12-20 DIAGNOSIS — Z1151 Encounter for screening for human papillomavirus (HPV): Secondary | ICD-10-CM

## 2011-12-20 LAB — CYSTIC FIBROSIS DIAGNOSTIC STUDY

## 2011-12-20 MED ORDER — LABETALOL HCL 200 MG PO TABS: 200.0000 mg | ORAL_TABLET | Freq: Two times a day (BID) | ORAL | Status: DC

## 2011-12-20 MED ORDER — LABETALOL HCL 300 MG PO TABS: 200.0000 mg | ORAL_TABLET | Freq: Two times a day (BID) | ORAL | Status: DC

## 2011-12-20 MED ORDER — LABETALOL HCL 300 MG PO TABS: 300.0000 mg | ORAL_TABLET | Freq: Two times a day (BID) | ORAL | Status: DC

## 2011-12-20 NOTE — Patient Instructions (Addendum)
Pregnancy - First Trimester During sexual intercourse, millions of sperm go into the vagina. Only 1 sperm will penetrate and fertilize the female egg while it is in the Fallopian tube. One week later, the fertilized egg implants into the wall of the uterus. An embryo begins to develop into a baby. At 6 to 8 weeks, the eyes and face are formed and the heartbeat can be seen on ultrasound. At the end of 12 weeks (first trimester), all the baby's organs are formed. Now that you are pregnant, you will want to do everything you can to have a healthy baby. Two of the most important things are to get good prenatal care and follow your caregiver's instructions. Prenatal care is all the medical care you receive before the baby's birth. It is given to prevent, find, and treat problems during the pregnancy and childbirth. PRENATAL EXAMS  During prenatal visits, your weight, blood pressure and urine are checked. This is done to make sure you are healthy and progressing normally during the pregnancy.  A pregnant woman should gain 25 to 35 pounds during the pregnancy. However, if you are over weight or underweight, your caregiver will advise you regarding your weight.  Your caregiver will ask and answer questions for you.  Blood work, cervical cultures, other necessary tests and a Pap test are done during your prenatal exams. These tests are done to check on your health and the probable health of your baby. Tests are strongly recommended and done for HIV with your permission. This is the virus that causes AIDS. These tests are done because medications can be given to help prevent your baby from being born with this infection should you have been infected without knowing it. Blood work is also used to find out your blood type, previous infections and follow your blood levels (hemoglobin).  Low hemoglobin (anemia) is common during pregnancy. Iron and vitamins are given to help prevent this. Later in the pregnancy, blood  tests for diabetes will be done along with any other tests if any problems develop. You may need tests to make sure you and the baby are doing well.  You may need other tests to make sure you and the baby are doing well. CHANGES DURING THE FIRST TRIMESTER (THE FIRST 3 MONTHS OF PREGNANCY) Your body goes through many changes during pregnancy. They vary from person to person. Talk to your caregiver about changes you notice and are concerned about. Changes can include:  Your menstrual period stops.  The egg and sperm carry the genes that determine what you look like. Genes from you and your partner are forming a baby. The female genes determine whether the baby is a boy or a girl.  Your body increases in girth and you may feel bloated.  Feeling sick to your stomach (nauseous) and throwing up (vomiting). If the vomiting is uncontrollable, call your caregiver.  Your breasts will begin to enlarge and become tender.  Your nipples may stick out more and become darker.  The need to urinate more. Painful urination may mean you have a bladder infection.  Tiring easily.  Loss of appetite.  Cravings for certain kinds of food.  At first, you may gain or lose a couple of pounds.  You may have changes in your emotions from day to day (excited to be pregnant or concerned something may go wrong with the pregnancy and baby).  You may have more vivid and strange dreams. HOME CARE INSTRUCTIONS   It is very important   to avoid all smoking, alcohol and un-prescribed drugs during your pregnancy. These affect the formation and growth of the baby. Avoid chemicals while pregnant to ensure the delivery of a healthy infant.  Start your prenatal visits by the 12th week of pregnancy. They are usually scheduled monthly at first, then more often in the last 2 months before delivery. Keep your caregiver's appointments. Follow your caregiver's instructions regarding medication use, blood and lab tests, exercise, and  diet.  During pregnancy, you are providing food for you and your baby. Eat regular, well-balanced meals. Choose foods such as meat, fish, milk and other low fat dairy products, vegetables, fruits, and whole-grain breads and cereals. Your caregiver will tell you of the ideal weight gain.  You can help morning sickness by keeping soda crackers at the bedside. Eat a couple before arising in the morning. You may want to use the crackers without salt on them.  Eating 4 to 5 small meals rather than 3 large meals a day also may help the nausea and vomiting.  Drinking liquids between meals instead of during meals also seems to help nausea and vomiting.  A physical sexual relationship may be continued throughout pregnancy if there are no other problems. Problems may be early (premature) leaking of amniotic fluid from the membranes, vaginal bleeding, or belly (abdominal) pain.  Exercise regularly if there are no restrictions. Check with your caregiver or physical therapist if you are unsure of the safety of some of your exercises. Greater weight gain will occur in the last 2 trimesters of pregnancy. Exercising will help:  Control your weight.  Keep you in shape.  Prepare you for labor and delivery.  Help you lose your pregnancy weight after you deliver your baby.  Wear a good support or jogging bra for breast tenderness during pregnancy. This may help if worn during sleep too.  Ask when prenatal classes are available. Begin classes when they are offered.  Do not use hot tubs, steam rooms or saunas.  Wear your seat belt when driving. This protects you and your baby if you are in an accident.  Avoid raw meat, uncooked cheese, cat litter boxes and soil used by cats throughout the pregnancy. These carry germs that can cause birth defects in the baby.  The first trimester is a good time to visit your dentist for your dental health. Getting your teeth cleaned is OK. Use a softer toothbrush and brush  gently during pregnancy.  Ask for help if you have financial, counseling or nutritional needs during pregnancy. Your caregiver will be able to offer counseling for these needs as well as refer you for other special needs.  Do not take any medications or herbs unless told by your caregiver.  Inform your caregiver if there is any mental or physical domestic violence.  Make a list of emergency phone numbers of family, friends, hospital, and police and fire departments.  Write down your questions. Take them to your prenatal visit.  Do not douche.  Do not cross your legs.  If you have to stand for long periods of time, rotate you feet or take small steps in a circle.  You may have more vaginal secretions that may require a sanitary pad. Do not use tampons or scented sanitary pads. MEDICATIONS AND DRUG USE IN PREGNANCY  Take prenatal vitamins as directed. The vitamin should contain 1 milligram of folic acid. Keep all vitamins out of reach of children. Only a couple vitamins or tablets containing iron may be   fatal to a baby or young child when ingested.  Avoid use of all medications, including herbs, over-the-counter medications, not prescribed or suggested by your caregiver. Only take over-the-counter or prescription medicines for pain, discomfort, or fever as directed by your caregiver. Do not use aspirin, ibuprofen, or naproxen unless directed by your caregiver.  Let your caregiver also know about herbs you may be using.  Alcohol is related to a number of birth defects. This includes fetal alcohol syndrome. All alcohol, in any form, should be avoided completely. Smoking will cause low birth rate and premature babies.  Street or illegal drugs are very harmful to the baby. They are absolutely forbidden. A baby born to an addicted mother will be addicted at birth. The baby will go through the same withdrawal an adult does.  Let your caregiver know about any medications that you have to take  and for what reason you take them. MISCARRIAGE IS COMMON DURING PREGNANCY A miscarriage does not mean you did something wrong. It is not a reason to worry about getting pregnant again. Your caregiver will help you with questions you may have. If you have a miscarriage, you may need minor surgery. SEEK MEDICAL CARE IF:  You have any concerns or worries during your pregnancy. It is better to call with your questions if you feel they cannot wait, rather than worry about them. SEEK IMMEDIATE MEDICAL CARE IF:   An unexplained oral temperature above 102 F (38.9 C) develops, or as your caregiver suggests.  You have leaking of fluid from the vagina (birth canal). If leaking membranes are suspected, take your temperature and inform your caregiver of this when you call.  There is vaginal spotting or bleeding. Notify your caregiver of the amount and how many pads are used.  You develop a bad smelling vaginal discharge with a change in the color.  You continue to feel sick to your stomach (nauseated) and have no relief from remedies suggested. You vomit blood or coffee ground-like materials.  You lose more than 2 pounds of weight in 1 week.  You gain more than 2 pounds of weight in 1 week and you notice swelling of your face, hands, feet, or legs.  You gain 5 pounds or more in 1 week (even if you do not have swelling of your hands, face, legs, or feet).  You get exposed to [place] measles and have never had them.  You are exposed to fifth disease or chickenpox.  You develop belly (abdominal) pain. Round ligament discomfort is a common non-cancerous (benign) cause of abdominal pain in pregnancy. Your caregiver still must evaluate this.  You develop headache, fever, diarrhea, pain with urination, or shortness of breath.  You fall or are in a car accident or have any kind of trauma.  There is mental or physical violence in your home. Document Released: 02/06/2001 Document Revised: 05/07/2011  Document Reviewed: 08/10/2008 [facility] Patient Information 2013 [place], [place]. Secondhand Smoke Secondhand smoke is the smoke exhaled by smokersand the smoke given off by a burning cigarette, cigar, or pipe. When a cigarette is smoked, about half of the smoke is inhaled and exhaled by the smoker, and the other half floats around in the air. Exposure to secondhand smoke is also called involuntary smoking or passive smoking. People can be exposed to secondhand smoke in:   Homes.  Cars.  Workplaces.  Public places (bars, restaurants, other recreation sites). Exposure to secondhand smoke is hazardous.It contains more than 250 harmful chemicals, including at least  60 that can cause cancer. These chemicals include:  Arsenic, a heavy metal toxin.  Benzene, a chemical found in gasoline.  Beryllium, a toxic metal.  Cadmium, a metal used in batteries.  Chromium, a metallic element.  Ethylene oxide, a chemical used to sterilize medical devices.  Nickel, a metallic element.  Polonium 210, a chemical element that gives off radiation.  Vinyl chloride, a toxic substance used in the Building control surveyor. Nonsmoking spouses and family members of smokers have higher rates of cancer, heart disease, and serious respiratory illnesses than those not exposed to secondhand smoke.  Nicotine, a nicotine by-product called cotinine, carbon monoxide, and other evidence of secondhand smoke exposure have been found in the body fluids of nonsmokers exposed to secondhand smoke.  Living with a smoker may increase a nonsmoker's chances of developing lung cancer by 20 to 30 percent.  Secondhand smoke may increase the risk of breast cancer, nasal sinus cavity cancer, cervical cancer, bladder cancer, and nose and throat (nasopharyngeal)cancer in adults.  Secondhand smoke may increase the risk of heart disease by 25 to 30 percent. Children are especially at risk from secondhand smoke exposure. Children of  smokers have higher rates of:  Pneumonia.  Asthma.  Smoking.  Bronchitis.  Colds.  Chronic cough.  Ear infections.  Tonsilitis.  School absences. Research suggests that exposure to secondhand smoke may cause leukemia, lymphoma, and brain tumors in children. Babies are three times more likely to die from sudden infant death syndrome (SIDS) if their mothers smoked during and after pregnancy. There is no safe level of exposure to secondhand smoke. Studies have shown that even low levels of exposure can be harmful. The only way to fully protect nonsmokers from secondhand smoke exposure is to completely eliminate smoking in indoor spaces. The best thing you can do for your own health and for your children's health is to stop smoking. You should stop as soon as possible. This is not easy, and you may fail several times at quitting before you get free of this addiction. Nicotine replacement therapy ( such as patches, gum, or lozenges) can help. These therapies can help you deal with the physical symptoms of withdrawal. Attending quit-smoking support groups can help you deal with the emotional issues of quitting smoking.  Even if you are not ready to quit right now, there are some simple changes you can make to reduce the effect of your smoking on your family:  Do not smoke in your home. Smoke away from your home in an open area, preferably outside.  Ask others to not smoke in your home.  Do not smoke while holding a child or when children are near.  Do not smoke in your car.  Avoid restaurants, day care centers, and other places that allow smoking. Document Released: 03/22/2004 Document Revised: 05/07/2011 Document Reviewed: 11/24/2008 [facility] Patient Information 2013 [place], [place].

## 2011-12-20 NOTE — Telephone Encounter (Signed)
none

## 2011-12-20 NOTE — Progress Notes (Signed)
  Subjective:    Brittany Grant is being seen today for her first obstetrical visit.  She is at [redacted]w[redacted]d gestation. Her obstetrical history is significant for advanced maternal age, smoker and chronic hypertension. Relationship with FOB: significant other, not living together. Patient does intend to breast feed. Pregnancy history fully reviewed.  Patient reports no complaints.  Review of Systems:   Review of Systems  Objective:     BP 137/102  Wt 197 lb (89.359 kg)  LMP 11/09/2011 Physical Exam  ExamBP 137/102  Wt 197 lb (89.359 kg)  LMP 11/09/2011 Lungs: CTA CV: RRR Breast: nontender, no masses, no nipple discharge Skin: multiple keloids GU: EGBUS: no lesions Vagina: no blood in vault Cervix: no lesion; no mucopurulent d/c Uterus: small ~6weeks, mobile Adnexa: no masses; sl tender         Assessment:    Pregnancy: [id number] Patient Active Problem List  Diagnosis  . HYPERTENSION  . GERD  . Headache  . Muscle twitching  . Tobacco smoking complicating pregnancy       Plan:     Initial labs drawn. Prenatal vitamins. Problem list reviewed and updated. AFP3 discussed: requested. Role of ultrasound in pregnancy discussed; fetal survey: requested. Amniocentesis discussed: not discussed today. Follow up in 4 weeks to see physician and 2 weeks for BP check   70% of 30 min visit spent on counseling and coordination of care.  D/w pt tobacco cessation  Reveied HTN.  Pt prev on HCTZ- as BP are still elevated, will begin Labetalol 200 bid F/u 2week for BP check and $ weeks for physician visit Genetic testing at next visit  Zaviyar Rahal L. Harraway-Smith, M.D., [facility], [place] 12/20/2011    Brittany Grant is a 41 y.o. female presenting for new OB visit. History OB History    Grav Para Term Preterm Abortions TAB SAB Ect Mult Living   4 2 2  0 0 0 0 0 0 2     Past Medical History  Diagnosis Date  . Hypertension   . GERD (gastroesophageal  reflux disease)   . IUD 04/26/2010  . Obesity   . Scarring, keloid    No past surgical history on file. Family History: family history includes Diabetes in her father and mother and Hypertension in her fathers, mother, and other. Social History:  reports that she has been smoking Cigarettes.  She has a 2.5 pack-year smoking history. She has never used smokeless tobacco. She reports that she does not drink alcohol or use illicit drugs.   Prenatal Transfer Tool  Maternal Diabetes: No   ROS    Blood pressure 137/102, weight 197 lb (89.359 kg), last menstrual period 11/09/2011. Exam Physical Exam  Prenatal labs: ABO, Rh: O/POS/-- (10/21 1607) Antibody: NEG (10/21 1607) Rubella: 113.9 (10/21 1607) RPR: NON REAC (10/21 1607)  HBsAg: NEGATIVE (10/21 1607)  HIV: NON REACTIVE (10/21 1607)  GBS:     Assessment/Plan: New OB visit.  See above   HARRAWAY-SMITH, Cartel Mauss 12/20/2011, 10:18 AM

## 2011-12-21 LAB — CULTURE, OB URINE: Colony Count: >=100000

## 2011-12-23 MED ORDER — AMOXICILLIN 500 MG PO CAPS: 500.0000 mg | ORAL_CAPSULE | Freq: Three times a day (TID) | ORAL | Status: DC

## 2011-12-23 NOTE — Progress Notes (Signed)
Lab Addendum: Urine culture showed >100K pansensitive E.coli. Amoxicillin e-prescribed for patient.

## 2011-12-25 ENCOUNTER — Telehealth: Payer: Self-pay | Admitting: *Deleted

## 2011-12-25 NOTE — Telephone Encounter (Signed)
Message copied by Barbara Cower on Tue Dec 25, 2011  2:01 PM ------      Message from: Jaynie Collins A      Created: Sun Dec 23, 2011 10:39 AM       Amoxicillin prescribed.  Please call patient to pick up prescription.

## 2011-12-25 NOTE — Telephone Encounter (Signed)
Patient has been notified and will pick up rx today.

## 2012-01-15 ENCOUNTER — Telehealth: Payer: Self-pay | Admitting: *Deleted

## 2012-01-15 NOTE — Telephone Encounter (Signed)
Pt called requesting to know what OTC meds she can take for nasal congestion and sore throat. I advised her that she may take the following: Tylenol, saline nasal spray, Zyrtec or Claritin, sore throat spray and any OTC cough drop or throat lozenge.  Pt should also use warm salt water gargles several times daily. Pt currently denies chest congestion and cough. She was informed that she may take Mucinex and/or any OTC cough syrup if this sx occurs.  Pt has been using phenylephrine for nasal congestion and was told to d/c this medication. Pt voiced understanding of all information and advice. She will keep appt on 11/22 as scheduled.

## 2012-01-18 ENCOUNTER — Ambulatory Visit (INDEPENDENT_AMBULATORY_CARE_PROVIDER_SITE_OTHER): Payer: BC Managed Care – PPO | Admitting: Obstetrics & Gynecology

## 2012-01-18 ENCOUNTER — Other Ambulatory Visit: Payer: Self-pay | Admitting: Obstetrics & Gynecology

## 2012-01-18 ENCOUNTER — Encounter: Payer: Self-pay | Admitting: Obstetrics & Gynecology

## 2012-01-18 VITALS — BP 129/92 | Wt 203.0 lb

## 2012-01-18 DIAGNOSIS — O09529 Supervision of elderly multigravida, unspecified trimester: Secondary | ICD-10-CM

## 2012-01-18 DIAGNOSIS — IMO0002 Reserved for concepts with insufficient information to code with codable children: Secondary | ICD-10-CM

## 2012-01-18 DIAGNOSIS — Z3480 Encounter for supervision of other normal pregnancy, unspecified trimester: Secondary | ICD-10-CM

## 2012-01-18 DIAGNOSIS — I10 Essential (primary) hypertension: Secondary | ICD-10-CM

## 2012-01-18 DIAGNOSIS — Z3682 Encounter for antenatal screening for nuchal translucency: Secondary | ICD-10-CM

## 2012-01-18 DIAGNOSIS — O99330 Smoking (tobacco) complicating pregnancy, unspecified trimester: Secondary | ICD-10-CM

## 2012-01-18 NOTE — Progress Notes (Signed)
Still having some morning sickness.

## 2012-01-18 NOTE — Progress Notes (Signed)
Routine visit.  No problems. We continued the discussion about genetic testing. She would like to have the Harmony test and NT. I will order/arrange them. I have recommended smoking cessation.

## 2012-02-01 ENCOUNTER — Ambulatory Visit (HOSPITAL_COMMUNITY)
Admission: RE | Admit: 2012-02-01 | Discharge: 2012-02-01 | Disposition: A | Discharge status: Home / Self Care | Payer: BC Managed Care – PPO | Source: Ambulatory Visit | Attending: Family Medicine | Admitting: Family Medicine

## 2012-02-01 ENCOUNTER — Ambulatory Visit (HOSPITAL_COMMUNITY)
Admission: RE | Admit: 2012-02-01 | Discharge: 2012-02-01 | Disposition: A | Discharge status: Home / Self Care | Payer: BC Managed Care – PPO | Source: Ambulatory Visit | Attending: Obstetrics & Gynecology | Admitting: Obstetrics & Gynecology

## 2012-02-01 ENCOUNTER — Other Ambulatory Visit: Payer: Self-pay | Admitting: Obstetrics & Gynecology

## 2012-02-01 ENCOUNTER — Other Ambulatory Visit: Payer: Self-pay

## 2012-02-01 VITALS — BP 128/85 | HR 80

## 2012-02-01 DIAGNOSIS — O09529 Supervision of elderly multigravida, unspecified trimester: Secondary | ICD-10-CM

## 2012-02-01 DIAGNOSIS — O10019 Pre-existing essential hypertension complicating pregnancy, unspecified trimester: Secondary | ICD-10-CM | POA: Insufficient documentation

## 2012-02-01 DIAGNOSIS — O169 Unspecified maternal hypertension, unspecified trimester: Secondary | ICD-10-CM

## 2012-02-01 DIAGNOSIS — Z3682 Encounter for antenatal screening for nuchal translucency: Secondary | ICD-10-CM

## 2012-02-01 NOTE — Progress Notes (Signed)
Ms. Alferd Patee was seen for ultrasound appointment today.  Please see [facility] report for details.

## 2012-02-04 NOTE — Progress Notes (Signed)
Genetic Counseling  High-Risk Gestation Note  Appointment Date:  02/01/2012 Referred By: Reva Bores, MD Date of Birth:  [birth year]  Pregnancy History: [id number] Estimated Date of Delivery: 08/15/12 Estimated Gestational Age: [redacted]w[redacted]d Attending: Rema Fendt, MD    Ms. Brittany Grant was seen for genetic counseling because of a maternal age of 41 y.o..  She was accompanied by a friend to today's visit.    They were counseled regarding maternal age and the association with risk for chromosome conditions due to nondisjunction with aging of the ova.   We reviewed chromosomes, nondisjunction, and the associated 1 in 36 risk for fetal aneuploidy at [redacted]w[redacted]d gestation related to a maternal age of 41 y.o. at delivery.  They were counseled that the risk for aneuploidy decreases as gestational age increases, accounting for those pregnancies which spontaneously abort.  We specifically discussed Down syndrome (trisomy 65), trisomies 65 and 85, and sex chromosome aneuploidies (47,XXX and 47,XXY) including the common features and prognoses of each.   We reviewed available screening options including First screen, Quad screen, noninvasive prenatal testing (NIPT), and detailed ultrasound. She understands that screening tests are used to modify a patient's a priori risk for aneuploidy, typically based on age.  This estimate provides a pregnancy specific risk assessment.  Specifically, we discussed that NIPT analyzes cell free fetal DNA found in the maternal circulation. This test is not diagnostic for chromosome conditions, but can provide information regarding the presence or absence of extra fetal DNA for chromosomes 13, 18, 21, X, and Y, and missing fetal DNA for chromosome X and Y (Turner syndrome). Thus, it would not identify or rule out all genetic conditions. The reported detection rate is greater than 99% for Trisomy 21, greater than 98% for Trisomy 18, and is approximately 80% (8 out of 10) for Trisomy 13.  The false positive rate is reported to be less than 0.1% for any of these conditions.  In addition, we discussed that ~50-80% of fetuses with Down syndrome and up to 90-95% of fetuses with trisomy 18/13, when well visualized, have detectable anomalies or soft markers by detailed ultrasound (~18+ weeks gestation).   She/they were also counseled regarding diagnostic testing via CVS or amniocentesis.  We reviewed the approximate 1 in 100 risk for complications with CVS and the approximate 1 in 300-500 risk for complications for amniocentesis, including spontaneous pregnancy loss. We discussed the risks, limitations, and benefits of each screening and testing option. After consideration of all the options, and a clear understanding of the newness and limitations of NIPT, she elected to proceed with NIPT (Harmony) at the time of today's visit and ultrasound for nuchal translucency assessment.  A complete ultrasound was performed today.  Nuchal translucency assessment was not obtained today due to fetal position. The patient elected to return in one week for re-attempt at nuchal translucency assessment. The patient declined CVS and amniocentesis at this time given the associated risk of complications. The ultrasound report is under separate cover. She understands that ultrasound cannot rule out all birth defects or genetic syndromes. The patient was advised of this limitation and states she still does not want diagnostic testing at this time.   Ms. Brittany Grant was provided with written information regarding sickle cell anemia (SCA) including the carrier frequency and incidence in the African-American population, the availability of carrier testing and prenatal diagnosis if indicated.  In addition, we discussed that hemoglobinopathies are routinely screened for as part of the [place] newborn screening panel.  Hemoglobin electrophoresis  was previously performed and indicated the presence of normal adult hemoglobin (Hgb  AA). Thus, Ms. Brittany Grant does not appear to have sickle cell trait.   Both family histories were reviewed and found to be contributory for blindness for the father of the pregnancy. He reportedly had progressive blindness in his left eye and received a cornea transplant this year through [facility] at age 68 years old. His maternal uncle also reportedly has a history of cornea implant. Limited information was known regarding this relative. The patient reported that the particular condition is genetic but she cannot remember the name of the condition. We discussed that given the reported family history autosomal dominant inheritance with reduced penetrance or X-linked inheritance would be the most likely forms of inheritance being displayed in the family, in the case of an underlying genetic condition. We reviewed these patterns of inheritance. In autosomal dominant inheritance, each offspring has a 1 in 2 (50%) chance to inherit the gene change that causes the condition. With reduced penetrance, an individual who inherits the gene change may not necessarily display features but would be at risk to pass it to their offspring. In X-linked inheritance, a female carries the gene chance and is typically asymptomatic or mildly symptomatic. They then have a 1 in 4 (25%) chance with each offspring to have a female with the condition. Males who have X-linked disorders will pass on the gene change to all daughters, who would be expected to be carriers, but typically have unaffected sons. However, in the case of a different underlying etiology, recurrence risk may be altered. We encouraged the patient to contact us with additional information regarding the condition in the family so that recurrence risk for the current pregnancy can accurately be assessed. In the case that an underlying gene change has been identified, prenatal diagnosis via amniocentesis or CVS would possibly be available for at risk  pregnancies.    Additionally, the patient reported the father of the pregnancy has another maternal uncle with mental delays. He is currently in his early 79s and has no physical differences from relatives. An underlying cause is not known for his mental delays. The patient also reported multiple maternal second cousins (her maternal grandfather's sibling's grandchildren) with mental delays, both males and females. One of the female cousins was described to have a large head. They are otherwise reportedly healthy, and an underlying cause is not known. We discussed that there are many different causes of mental retardation such as genetic differences, sporadic causes, or injuries. A specific diagnosis for mental retardation can be determined in approximately 50% of cases. In the remaining 50% of cases, a diagnosis may not ever be determined.  Without more specific information, potential genetic risks cannot be determined. We discussed the option of carrier screening for Fragile X syndrome, which is an inherited form of intellectual disabilities that follows X-linked inheritance. Ms. Brittany Grant declined carrier screening for this condition at this time.   Additionally, Ms. Grant reported her brother died at age 85 years due to lung failure. He reportedly had this his whole life, and the patient did not know the underlying etiology. We discussed that additional information is needed to assess if there was a sporadic, environmental, genetic, or multifactorial etiology. The patient reported no additional relatives with early onset lung disease. We are available to review medical records, if desired, to assess for possible underlying etiology in order to accurately assess recurrence risk for relatives. Without further information regarding the provided  family history, an accurate genetic risk cannot be calculated. Further genetic counseling is warranted if more information is obtained.  Ms. Brittany Grant denied  exposure to environmental toxins or chemical agents. She denied the use of alcohol or street drugs. She reported smoking approximately 3 cigarettes per day and continues to work on cutting back. The associations of smoking in pregnancy were reviewed and cessation encouraged. She denied significant viral illnesses during the course of her pregnancy. Her medical and surgical histories were contributory for high blood pressure, for which she is treated with labetalol.   I counseled Ms. Brittany Grant regarding the above risks and available options. The approximate face-to-face time with the genetic counselor was 40 minutes.  Quinn Plowman, MS,  Certified Genetic Counselor 02/04/2012

## 2012-02-07 ENCOUNTER — Other Ambulatory Visit (HOSPITAL_COMMUNITY): Payer: Self-pay | Admitting: Maternal and Fetal Medicine

## 2012-02-07 DIAGNOSIS — Z3682 Encounter for antenatal screening for nuchal translucency: Secondary | ICD-10-CM

## 2012-02-08 ENCOUNTER — Ambulatory Visit (HOSPITAL_COMMUNITY)
Admission: RE | Admit: 2012-02-08 | Discharge: 2012-02-08 | Disposition: A | Discharge status: Home / Self Care | Payer: BC Managed Care – PPO | Source: Ambulatory Visit | Attending: Family Medicine | Admitting: Family Medicine

## 2012-02-08 VITALS — BP 132/95 | HR 82 | Wt 203.0 lb

## 2012-02-08 DIAGNOSIS — Z3682 Encounter for antenatal screening for nuchal translucency: Secondary | ICD-10-CM

## 2012-02-08 DIAGNOSIS — O09529 Supervision of elderly multigravida, unspecified trimester: Secondary | ICD-10-CM

## 2012-02-08 DIAGNOSIS — O10019 Pre-existing essential hypertension complicating pregnancy, unspecified trimester: Secondary | ICD-10-CM | POA: Insufficient documentation

## 2012-02-08 NOTE — Progress Notes (Signed)
Brittany Grant was seen for ultrasound appointment today.  Please see [facility] report for details.  

## 2012-02-12 ENCOUNTER — Telehealth (HOSPITAL_COMMUNITY): Payer: Self-pay | Admitting: MS"

## 2012-02-12 ENCOUNTER — Ambulatory Visit (INDEPENDENT_AMBULATORY_CARE_PROVIDER_SITE_OTHER): Payer: BC Managed Care – PPO | Admitting: Family Medicine

## 2012-02-12 VITALS — BP 116/89 | Wt 205.0 lb

## 2012-02-12 DIAGNOSIS — Z3480 Encounter for supervision of other normal pregnancy, unspecified trimester: Secondary | ICD-10-CM

## 2012-02-12 DIAGNOSIS — O10019 Pre-existing essential hypertension complicating pregnancy, unspecified trimester: Secondary | ICD-10-CM

## 2012-02-12 NOTE — Telephone Encounter (Signed)
Called Brittany Grant to discuss her Harmony, cell free fetal DNA testing. Testing was offered because of advanced maternal age. We reviewed that these are within normal limits, showing a less than 1 in 10,000 risk for trisomies 21, 18 and 13. We reviewed that this testing identifies > 99% of pregnancies with trisomy 21, >98% of pregnancies with trisomy 48, and >80% with trisomy 50; the false positive rate is <0.1% for all conditions. Testing was also performed for X and Y chromosome analysis. This did not show evidence of aneuploidy for X or Y. It was also consistent with female gender. X and Y analysis has a detection rate of approximately 99%. She understands that this testing does not identify all genetic conditions. All questions were answered to her satisfaction, she was encouraged to call with additional questions or concerns.  Quinn Plowman, MS Certified Genetic Counselor 02/12/2012 3:08 PM

## 2012-02-12 NOTE — Patient Instructions (Signed)
Pregnancy - Second Trimester The second trimester of pregnancy (3 to 6 months) is a period of rapid growth for you and your baby. At the end of the sixth month, your baby is about 9 inches long and weighs 1 1/2 pounds. You will begin to feel the baby move between 18 and 20 weeks of the pregnancy. This is called quickening. Weight gain is faster. A clear fluid (colostrum) may leak out of your breasts. You may feel small contractions of the womb (uterus). This is known as false labor or Braxton-Hicks contractions. This is like a practice for labor when the baby is ready to be born. Usually, the problems with morning sickness have usually passed by the end of your first trimester. Some women develop small dark blotches (called cholasma, mask of pregnancy) on their face that usually goes away after the baby is born. Exposure to the sun makes the blotches worse. Acne may also develop in some pregnant women and pregnant women who have acne, may find that it goes away. PRENATAL EXAMS  Blood work may continue to be done during prenatal exams. These tests are done to check on your health and the probable health of your baby. Blood work is used to follow your blood levels (hemoglobin). Anemia (low hemoglobin) is common during pregnancy. Iron and vitamins are given to help prevent this. You will also be checked for diabetes between 24 and 28 weeks of the pregnancy. Some of the previous blood tests may be repeated.  The size of the uterus is measured during each visit. This is to make sure that the baby is continuing to grow properly according to the dates of the pregnancy.  Your blood pressure is checked every prenatal visit. This is to make sure you are not getting toxemia.  Your urine is checked to make sure you do not have an infection, diabetes or protein in the urine.  Your weight is checked often to make sure gains are happening at the suggested rate. This is to ensure that both you and your baby are  growing normally.  Sometimes, an ultrasound is performed to confirm the proper growth and development of the baby. This is a test which bounces harmless sound waves off the baby so your caregiver can more accurately determine due dates. Sometimes, a specialized test is done on the amniotic fluid surrounding the baby. This test is called an amniocentesis. The amniotic fluid is obtained by sticking a needle into the belly (abdomen). This is done to check the chromosomes in instances where there is a concern about possible genetic problems with the baby. It is also sometimes done near the end of pregnancy if an early delivery is required. In this case, it is done to help make sure the baby's lungs are mature enough for the baby to live outside of the womb. CHANGES OCCURING IN THE SECOND TRIMESTER OF PREGNANCY Your body goes through many changes during pregnancy. They vary from person to person. Talk to your caregiver about changes you notice that you are concerned about.  During the second trimester, you will likely have an increase in your appetite. It is normal to have cravings for certain foods. This varies from person to person and pregnancy to pregnancy.  Your lower abdomen will begin to bulge.  You may have to urinate more often because the uterus and baby are pressing on your bladder. It is also common to get more bladder infections during pregnancy (pain with urination). You can help this by   drinking lots of fluids and emptying your bladder before and after intercourse.  You may begin to get stretch marks on your hips, abdomen, and breasts. These are normal changes in the body during pregnancy. There are no exercises or medications to take that prevent this change.  You may begin to develop swollen and bulging veins (varicose veins) in your legs. Wearing support hose, elevating your feet for 15 minutes, 3 to 4 times a day and limiting salt in your diet helps lessen the problem.  Heartburn may  develop as the uterus grows and pushes up against the stomach. Antacids recommended by your caregiver helps with this problem. Also, eating smaller meals 4 to 5 times a day helps.  Constipation can be treated with a stool softener or adding bulk to your diet. Drinking lots of fluids, vegetables, fruits, and whole grains are helpful.  Exercising is also helpful. If you have been very active up until your pregnancy, most of these activities can be continued during your pregnancy. If you have been less active, it is helpful to start an exercise program such as walking.  Hemorrhoids (varicose veins in the rectum) may develop at the end of the second trimester. Warm sitz baths and hemorrhoid cream recommended by your caregiver helps hemorrhoid problems.  Backaches may develop during this time of your pregnancy. Avoid heavy lifting, wear low heal shoes and practice good posture to help with backache problems.  Some pregnant women develop tingling and numbness of their hand and fingers because of swelling and tightening of ligaments in the wrist (carpel tunnel syndrome). This goes away after the baby is born.  As your breasts enlarge, you may have to get a bigger bra. Get a comfortable, cotton, support bra. Do not get a nursing bra until the last month of the pregnancy if you will be nursing the baby.  You may get a dark line from your belly button to the pubic area called the linea nigra.  You may develop rosy cheeks because of increase blood flow to the face.  You may develop spider looking lines of the face, neck, arms and chest. These go away after the baby is born. HOME CARE INSTRUCTIONS   It is extremely important to avoid all smoking, herbs, alcohol, and unprescribed drugs during your pregnancy. These chemicals affect the formation and growth of the baby. Avoid these chemicals throughout the pregnancy to ensure the delivery of a healthy infant.  Most of your home care instructions are the same  as suggested for the first trimester of your pregnancy. Keep your caregiver's appointments. Follow your caregiver's instructions regarding medication use, exercise and diet.  During pregnancy, you are providing food for you and your baby. Continue to eat regular, well-balanced meals. Choose foods such as meat, fish, milk and other low fat dairy products, vegetables, fruits, and whole-grain breads and cereals. Your caregiver will tell you of the ideal weight gain.  A physical sexual relationship may be continued up until near the end of pregnancy if there are no other problems. Problems could include early (premature) leaking of amniotic fluid from the membranes, vaginal bleeding, abdominal pain, or other medical or pregnancy problems.  Exercise regularly if there are no restrictions. Check with your caregiver if you are unsure of the safety of some of your exercises. The greatest weight gain will occur in the last 2 trimesters of pregnancy. Exercise will help you:  Control your weight.  Get you in shape for labor and delivery.  Lose weight   after you have the baby.  Wear a good support or jogging bra for breast tenderness during pregnancy. This may help if worn during sleep. Pads or tissues may be used in the bra if you are leaking colostrum.  Do not use hot tubs, steam rooms or saunas throughout the pregnancy.  Wear your seat belt at all times when driving. This protects you and your baby if you are in an accident.  Avoid raw meat, uncooked cheese, cat litter boxes and soil used by cats. These carry germs that can cause birth defects in the baby.  The second trimester is also a good time to visit your dentist for your dental health if this has not been done yet. Getting your teeth cleaned is OK. Use a soft toothbrush. Brush gently during pregnancy.  It is easier to loose urine during pregnancy. Tightening up and strengthening the pelvic muscles will help with this problem. Practice stopping  your urination while you are going to the bathroom. These are the same muscles you need to strengthen. It is also the muscles you would use as if you were trying to stop from passing gas. You can practice tightening these muscles up 10 times a set and repeating this about 3 times per day. Once you know what muscles to tighten up, do not perform these exercises during urination. It is more likely to contribute to an infection by backing up the urine.  Ask for help if you have financial, counseling or nutritional needs during pregnancy. Your caregiver will be able to offer counseling for these needs as well as refer you for other special needs.  Your skin may become oily. If so, wash your face with mild soap, use non-greasy moisturizer and oil or cream based makeup. MEDICATIONS AND DRUG USE IN PREGNANCY  Take prenatal vitamins as directed. The vitamin should contain 1 milligram of folic acid. Keep all vitamins out of reach of children. Only a couple vitamins or tablets containing iron may be fatal to a baby or young child when ingested.  Avoid use of all medications, including herbs, over-the-counter medications, not prescribed or suggested by your caregiver. Only take over-the-counter or prescription medicines for pain, discomfort, or fever as directed by your caregiver. Do not use aspirin.  Let your caregiver also know about herbs you may be using.  Alcohol is related to a number of birth defects. This includes fetal alcohol syndrome. All alcohol, in any form, should be avoided completely. Smoking will cause low birth rate and premature babies.  Street or illegal drugs are very harmful to the baby. They are absolutely forbidden. A baby born to an addicted mother will be addicted at birth. The baby will go through the same withdrawal an adult does. SEEK MEDICAL CARE IF:  You have any concerns or worries during your pregnancy. It is better to call with your questions if you feel they cannot wait,  rather than worry about them. SEEK IMMEDIATE MEDICAL CARE IF:   An unexplained oral temperature above 102 F (38.9 C) develops, or as your caregiver suggests.  You have leaking of fluid from the vagina (birth canal). If leaking membranes are suspected, take your temperature and tell your caregiver of this when you call.  There is vaginal spotting, bleeding, or passing clots. Tell your caregiver of the amount and how many pads are used. Light spotting in pregnancy is common, especially following intercourse.  You develop a bad smelling vaginal discharge with a change in the color from clear   to white.  You continue to feel sick to your stomach (nauseated) and have no relief from remedies suggested. You vomit blood or coffee ground-like materials.  You lose more than 2 pounds of weight or gain more than 2 pounds of weight over 1 week, or as suggested by your caregiver.  You notice swelling of your face, hands, feet, or legs.  You get exposed to German measles and have never had them.  You are exposed to fifth disease or chickenpox.  You develop belly (abdominal) pain. Round ligament discomfort is a common non-cancerous (benign) cause of abdominal pain in pregnancy. Your caregiver still must evaluate you.  You develop a bad headache that does not go away.  You develop fever, diarrhea, pain with urination, or shortness of breath.  You develop visual problems, blurry, or double vision.  You fall or are in a car accident or any kind of trauma.  There is mental or physical violence at home. Document Released: 02/06/2001 Document Revised: 05/07/2011 Document Reviewed: 08/11/2008 ExitCare Patient Information 2013 [employer].  Breastfeeding Deciding to breastfeed is one of the best choices you can make for you and your baby. The information that follows gives a brief overview of the benefits of breastfeeding as well as common topics surrounding breastfeeding. BENEFITS OF  BREASTFEEDING For the baby  The first milk (colostrum) helps the baby's digestive system function better.   There are antibodies in the mother's milk that help the baby fight off infections.   The baby has a lower incidence of asthma, allergies, and sudden infant death syndrome (SIDS).   The nutrients in breast milk are better for the baby than infant formulas, and breast milk helps the baby's brain grow better.   Babies who breastfeed have less gas, colic, and constipation.  For the mother  Breastfeeding helps develop a very special bond between the mother and her baby.   Breastfeeding is convenient, always available at the correct temperature, and costs nothing.   Breastfeeding burns calories in the mother and helps her lose weight that was gained during pregnancy.   Breastfeeding makes the uterus contract back down to normal size faster and slows bleeding following delivery.   Breastfeeding mothers have a lower risk of developing breast cancer.  BREASTFEEDING FREQUENCY  A healthy, full-term baby may breastfeed as often as every hour or space his or her feedings to every 3 hours.   Watch your baby for signs of hunger. Nurse your baby if he or she shows signs of hunger. How often you nurse will vary from baby to baby.   Nurse as often as the baby requests, or when you feel the need to reduce the fullness of your breasts.   Awaken the baby if it has been 3 4 hours since the last feeding.   Frequent feeding will help the mother make more milk and will help prevent problems, such as sore nipples and engorgement of the breasts.  BABY'S POSITION AT THE BREAST  Whether lying down or sitting, be sure that the baby's tummy is facing your tummy.   Support the breast with 4 fingers underneath the breast and the thumb above. Make sure your fingers are well away from the nipple and baby's mouth.   Stroke the baby's lips gently with your finger or nipple.   When the  baby's mouth is open wide enough, place all of your nipple and as much of the areola as possible into your baby's mouth.   Pull the baby in   close so the tip of the nose and the baby's cheeks touch the breast during the feeding.  FEEDINGS AND SUCTION  The length of each feeding varies from baby to baby and from feeding to feeding.   The baby must suck about 2 3 minutes for your milk to get to him or her. This is called a "let down." For this reason, allow the baby to feed on each breast as long as he or she wants. Your baby will end the feeding when he or she has received the right balance of nutrients.   To break the suction, put your finger into the corner of the baby's mouth and slide it between his or her gums before removing your breast from his or her mouth. This will help prevent sore nipples.  HOW TO TELL WHETHER YOUR BABY IS GETTING ENOUGH BREAST MILK. Wondering whether or not your baby is getting enough milk is a common concern among mothers. You can be assured that your baby is getting enough milk if:   Your baby is actively sucking and you hear swallowing.   Your baby seems relaxed and satisfied after a feeding.   Your baby nurses at least 8 12 times in a 24 hour time period. Nurse your baby until he or she unlatches or falls asleep at the first breast (at least 10 20 minutes), then offer the second side.   Your baby is wetting 5 6 disposable diapers (6 8 cloth diapers) in a 24 hour period by 5 6 days of age.   Your baby is having at least 3 4 stools every 24 hours for the first 6 weeks. The stool should be soft and yellow.   Your baby should gain 4 7 ounces per week after he or she is 4 days old.   Your breasts feel softer after nursing.  REDUCING BREAST ENGORGEMENT  In the first week after your baby is born, you may experience signs of breast engorgement. When breasts are engorged, they feel heavy, warm, full, and may be tender to the touch. You can reduce  engorgement if you:   Nurse frequently, every 2 3 hours. Mothers who breastfeed early and often have fewer problems with engorgement.   Place light ice packs on your breasts for 10 20 minutes between feedings. This reduces swelling. Wrap the ice packs in a lightweight towel to protect your skin. Bags of frozen vegetables work well for this purpose.   Take a warm shower or apply warm, moist heat to your breast for 5 10 minutes just before each feeding. This increases circulation and helps the milk flow.   Gently massage your breast before and during the feeding. Using your finger tips, massage from the chest wall towards your nipple in a circular motion.   Make sure that the baby empties at least one breast at every feeding before switching sides.   Use a breast pump to empty the breasts if your baby is sleepy or not nursing well. You may also want to pump if you are returning to work oryou feel you are getting engorged.   Avoid bottle feeds, pacifiers, or supplemental feedings of water or juice in place of breastfeeding. Breast milk is all the food your baby needs. It is not necessary for your baby to have water or formula. In fact, to help your breasts make more milk, it is best not to give your baby supplemental feedings during the early weeks.   Be sure the baby is latched   on and positioned properly while breastfeeding.   Wear a supportive bra, avoiding underwire styles.   Eat a balanced diet with enough fluids.   Rest often, relax, and take your prenatal vitamins to prevent fatigue, stress, and anemia.  If you follow these suggestions, your engorgement should improve in 24 48 hours. If you are still experiencing difficulty, call your lactation consultant or caregiver.  CARING FOR YOURSELF Take care of your breasts  Bathe or shower daily.   Avoid using soap on your nipples.   Start feedings on your left breast at one feeding and on your right breast at the next  feeding.   You will notice an increase in your milk supply 2 5 days after delivery. You may feel some discomfort from engorgement, which makes your breasts very firm and often tender. Engorgement "peaks" out within 24 48 hours. In the meantime, apply warm moist towels to your breasts for 5 10 minutes before feeding. Gentle massage and expression of some milk before feeding will soften your breasts, making it easier for your baby to latch on.   Wear a well-fitting nursing bra, and air dry your nipples for a 3 4minutes after each feeding.   Only use cotton bra pads.   Only use pure lanolin on your nipples after nursing. You do not need to wash it off before feeding the baby again. Another option is to express a few drops of breast milk and gently massage it into your nipples.  Take care of yourself  Eat well-balanced meals and nutritious snacks.   Drinking milk, fruit juice, and water to satisfy your thirst (about 8 glasses a day).   Get plenty of rest.  Avoid foods that you notice affect the baby in a bad way.  SEEK MEDICAL CARE IF:   You have difficulty with breastfeeding and need help.   You have a hard, red, sore area on your breast that is accompanied by a fever.   Your baby is too sleepy to eat well or is having trouble sleeping.   Your baby is wetting less than 6 diapers a day, by 5 days of age.   Your baby's skin or white part of his or her eyes is more yellow than it was in the hospital.   You feel depressed.  Document Released: 02/12/2005 Document Revised: 08/14/2011 Document Reviewed: 05/13/2011 ExitCare Patient Information 2013 [employer].  

## 2012-02-12 NOTE — Progress Notes (Signed)
Had harmony testing with MFM Needs baseline labs and 24 hour urine Early 1 hour next week

## 2012-02-18 ENCOUNTER — Other Ambulatory Visit (INDEPENDENT_AMBULATORY_CARE_PROVIDER_SITE_OTHER): Payer: BC Managed Care – PPO | Admitting: Gynecology

## 2012-02-18 DIAGNOSIS — O10019 Pre-existing essential hypertension complicating pregnancy, unspecified trimester: Secondary | ICD-10-CM

## 2012-02-18 DIAGNOSIS — Z3480 Encounter for supervision of other normal pregnancy, unspecified trimester: Secondary | ICD-10-CM

## 2012-02-19 LAB — COMPREHENSIVE METABOLIC PANEL
ALT: <8 U/L (ref 0–35)
AST: 16 U/L (ref 0–37)
Alkaline Phosphatase: 43 U/L (ref 39–117)
Potassium: 4.6 mEq/L (ref 3.5–5.3)
Sodium: 134 mEq/L — ABNORMAL LOW (ref 135–145)
Total Bilirubin: 0.3 mg/dL (ref 0.3–1.2)
Total Protein: 6.5 g/dL (ref 6.0–8.3)

## 2012-02-21 ENCOUNTER — Telehealth: Payer: Self-pay | Admitting: *Deleted

## 2012-02-21 ENCOUNTER — Encounter: Payer: Self-pay | Admitting: Family Medicine

## 2012-02-21 NOTE — Telephone Encounter (Signed)
Message copied by Gerome Apley on Thu Feb 21, 2012  1:55 PM ------      Message from: Reva Bores      Created: Thu Feb 21, 2012  8:42 AM       abnl 1 hour--needs 3 hour.

## 2012-02-21 NOTE — Telephone Encounter (Signed)
Per chart review was seen 02/12/12 and needs 4 week fu around 03/11/12

## 2012-02-21 NOTE — Telephone Encounter (Signed)
[name] and left message we are calling with some information from her provider, please call clinic during office hours.  Need to  tell her 1hr GTT abnormal- and needs appointment for 3 hr Gtt- also noted does not have any ob fu appts- need that scheduled as well.

## 2012-02-22 ENCOUNTER — Encounter: Payer: Self-pay | Admitting: *Deleted

## 2012-02-22 NOTE — Telephone Encounter (Signed)
Called and spoke w/pt. I informed her of abnormal 1hr GTT and need for 3hr GTT.  Pt stated that she is not available any morning next week but can come in for the test on 03/03/12.  I stated that I will get her scheduled and gave her the instructions for test. Pt voiced understanding and asked that I fax her a letter to [phone] regarding the appt so that she can give to her supervisor @ work.  I agreed.  After completion of our call, I realized that this pt receives her prenatal care @ the [facility] office. I called her back and left a message that she should go to that office on 03/03/12 for her 3hr GTT. I will send a message to Gaylyn Cheers CMA. If pt has questions she may call back to our office or to the office @ [facility].

## 2012-02-27 NOTE — L&D Delivery Note (Signed)
Attestation of Attending Supervision of Advanced Practitioner: Evaluation and management procedures were performed by the PA/NP/CNM/OB Fellow under my supervision/collaboration. Chart reviewed and agree with management and plan.  Kiamesha Samet V 08/13/2012 7:03 AM

## 2012-02-27 NOTE — L&D Delivery Note (Signed)
Operative Delivery Note Pt progressed quickly to ant lip/100/0 w/ strong urge to push and repetitive variables, so I was called to room by RN.  Ant lip reducible, so pt began to push, despite resting inbetween uc's, fetus developed bradycardia, IFSE was placed and Dr. Jolayne Panther was called to perform vacuum assisted delivery, and at 10:34 AM a viable female was delivered via vacuum-assisted vaginal Delivery by Dr. Jolayne Panther.  Presentation: vertex; Position: Right,, Occiput,, Anterior; Station: +1.  Verbal consent: obtained from patient.  Risks and benefits discussed in detail.  Risks include, but are not limited to the risks of anesthesia, bleeding, infection, damage to maternal tissues, fetal cephalhematoma.  There is also the risk of inability to effect vaginal delivery of the head, or shoulder dystocia that cannot be resolved by established maneuvers, leading to the need for emergency cesarean section.  NICU was called to be in attendance of delivery d/t bradycardia and vacuum-assist, please refer to their notes  APGAR: 5, 8; weight: not available at time of note.   Placenta status: delivered spontaneously intact, 3VC   Cord: 3 vessels with the following complications: none .  Cord pH (venous): 7.17  Anesthesia:  Mom receive fentanyl @ 0954 Instruments: Kiwi vacuum Episiotomy: none Lacerations: hemostatic 1st degree supraurethral and perineal- not repaired Suture Repair: n/a Est. Blood Loss (mL):  Mom to postpartum.  Baby to nursery-stable. Plans to bottlefeed, mirena for contraception.  Was on HCTZ prior to pregnancy, will restart. Check cbg's fasting and 2hr pp  Marge Duncans 08/09/2012, 10:51 AM

## 2012-03-03 ENCOUNTER — Other Ambulatory Visit (INDEPENDENT_AMBULATORY_CARE_PROVIDER_SITE_OTHER): Payer: BC Managed Care – PPO | Admitting: *Deleted

## 2012-03-03 DIAGNOSIS — I10 Essential (primary) hypertension: Secondary | ICD-10-CM

## 2012-03-03 DIAGNOSIS — R7309 Other abnormal glucose: Secondary | ICD-10-CM

## 2012-03-04 ENCOUNTER — Telehealth: Payer: Self-pay | Admitting: *Deleted

## 2012-03-04 DIAGNOSIS — O24419 Gestational diabetes mellitus in pregnancy, unspecified control: Secondary | ICD-10-CM

## 2012-03-04 LAB — COMPREHENSIVE METABOLIC PANEL WITH GFR: Glucose, Bld: 107 mg/dL — ABNORMAL HIGH (ref 70–99)

## 2012-03-04 LAB — CBC WITH DIFFERENTIAL/PLATELET
Basophils Relative: 0 % (ref 0–1)
Eosinophils Absolute: 0.2 10*3/uL (ref 0.0–0.7)
Eosinophils Relative: 2 % (ref 0–5)
MCH: 22.5 pg — ABNORMAL LOW (ref 26.0–34.0)
MCHC: 31.9 g/dL (ref 30.0–36.0)
MCV: 70.4 fL — ABNORMAL LOW (ref 78.0–100.0)
Neutrophils Relative %: 66 % (ref 43–77)
Platelets: 248 10*3/uL (ref 150–400)

## 2012-03-04 LAB — GLUCOSE TOLERANCE, 3 HOURS
Glucose Tolerance, 2 hour: 148 mg/dL (ref 70–164)
Glucose Tolerance, Fasting: 107 mg/dL — ABNORMAL HIGH (ref 70–104)

## 2012-03-04 NOTE — Telephone Encounter (Signed)
Patient notified of gestational diabetes diagnosis and will be set up for management as soon as possible.

## 2012-03-05 ENCOUNTER — Encounter: Payer: Self-pay | Admitting: Family Medicine

## 2012-03-05 DIAGNOSIS — O24919 Unspecified diabetes mellitus in pregnancy, unspecified trimester: Secondary | ICD-10-CM | POA: Insufficient documentation

## 2012-03-05 LAB — CREATININE CLEARANCE, URINE, 24 HOUR
Creatinine, 24H Ur: 1794 mg/d (ref 700–1800)
Creatinine, Urine: 119.6 mg/dL

## 2012-03-05 LAB — PROTEIN, URINE, 24 HOUR: Protein, Urine: 12 mg/dL

## 2012-03-06 ENCOUNTER — Encounter: Payer: Self-pay | Admitting: Family Medicine

## 2012-03-11 ENCOUNTER — Emergency Department: Payer: Self-pay | Admitting: Emergency Medicine

## 2012-03-11 ENCOUNTER — Encounter: Payer: Self-pay | Admitting: *Deleted

## 2012-03-11 ENCOUNTER — Telehealth: Payer: Self-pay | Admitting: *Deleted

## 2012-03-11 NOTE — Telephone Encounter (Signed)
Patient called stating she was having increased sharp pain in her right lower quadrant and she has not felt the baby move in four days when she has been consistently feeling movement.  I have advised her to go to [facility] to be evaluated due to the fact she is high risk.  She would like [place] to fax a note to her supervisor because she is at work.

## 2012-03-12 ENCOUNTER — Encounter: Payer: Self-pay | Admitting: Obstetrics and Gynecology

## 2012-03-12 ENCOUNTER — Ambulatory Visit (INDEPENDENT_AMBULATORY_CARE_PROVIDER_SITE_OTHER): Payer: BC Managed Care – PPO | Admitting: Obstetrics and Gynecology

## 2012-03-12 VITALS — BP 122/79 | Wt 207.0 lb

## 2012-03-12 DIAGNOSIS — Z3480 Encounter for supervision of other normal pregnancy, unspecified trimester: Secondary | ICD-10-CM

## 2012-03-12 DIAGNOSIS — O09529 Supervision of elderly multigravida, unspecified trimester: Secondary | ICD-10-CM

## 2012-03-12 DIAGNOSIS — O24919 Unspecified diabetes mellitus in pregnancy, unspecified trimester: Secondary | ICD-10-CM

## 2012-03-12 DIAGNOSIS — O10019 Pre-existing essential hypertension complicating pregnancy, unspecified trimester: Secondary | ICD-10-CM

## 2012-03-12 DIAGNOSIS — I10 Essential (primary) hypertension: Secondary | ICD-10-CM

## 2012-03-12 DIAGNOSIS — O99330 Smoking (tobacco) complicating pregnancy, unspecified trimester: Secondary | ICD-10-CM

## 2012-03-12 DIAGNOSIS — IMO0002 Reserved for concepts with insufficient information to code with codable children: Secondary | ICD-10-CM

## 2012-03-12 NOTE — Progress Notes (Signed)
Patient doing well without concerns. F/U anatomy ultrasound on 1/17. Patient scheduled to see diabetic educator on 1/22. Some diet education provided.

## 2012-03-14 ENCOUNTER — Ambulatory Visit (HOSPITAL_COMMUNITY)
Admission: RE | Admit: 2012-03-14 | Discharge: 2012-03-14 | Disposition: A | Discharge status: Home / Self Care | Payer: BC Managed Care – PPO | Source: Ambulatory Visit | Attending: Family Medicine | Admitting: Family Medicine

## 2012-03-14 VITALS — BP 121/87 | HR 89 | Wt 209.0 lb

## 2012-03-14 DIAGNOSIS — O10019 Pre-existing essential hypertension complicating pregnancy, unspecified trimester: Secondary | ICD-10-CM | POA: Insufficient documentation

## 2012-03-14 DIAGNOSIS — O99810 Abnormal glucose complicating pregnancy: Secondary | ICD-10-CM | POA: Insufficient documentation

## 2012-03-14 DIAGNOSIS — O09529 Supervision of elderly multigravida, unspecified trimester: Secondary | ICD-10-CM

## 2012-03-14 DIAGNOSIS — Z1389 Encounter for screening for other disorder: Secondary | ICD-10-CM | POA: Insufficient documentation

## 2012-03-14 DIAGNOSIS — O24919 Unspecified diabetes mellitus in pregnancy, unspecified trimester: Secondary | ICD-10-CM

## 2012-03-14 DIAGNOSIS — Z363 Encounter for antenatal screening for malformations: Secondary | ICD-10-CM | POA: Insufficient documentation

## 2012-03-14 DIAGNOSIS — O358XX0 Maternal care for other (suspected) fetal abnormality and damage, not applicable or unspecified: Secondary | ICD-10-CM | POA: Insufficient documentation

## 2012-03-26 ENCOUNTER — Ambulatory Visit: Payer: BC Managed Care – PPO

## 2012-04-09 ENCOUNTER — Ambulatory Visit (INDEPENDENT_AMBULATORY_CARE_PROVIDER_SITE_OTHER): Payer: BC Managed Care – PPO | Admitting: Obstetrics and Gynecology

## 2012-04-09 ENCOUNTER — Encounter: Payer: Self-pay | Admitting: Obstetrics and Gynecology

## 2012-04-09 VITALS — BP 102/77 | Wt 209.0 lb

## 2012-04-09 DIAGNOSIS — IMO0002 Reserved for concepts with insufficient information to code with codable children: Secondary | ICD-10-CM

## 2012-04-09 DIAGNOSIS — K219 Gastro-esophageal reflux disease without esophagitis: Secondary | ICD-10-CM

## 2012-04-09 DIAGNOSIS — Z3480 Encounter for supervision of other normal pregnancy, unspecified trimester: Secondary | ICD-10-CM

## 2012-04-09 DIAGNOSIS — O24919 Unspecified diabetes mellitus in pregnancy, unspecified trimester: Secondary | ICD-10-CM

## 2012-04-09 DIAGNOSIS — O09529 Supervision of elderly multigravida, unspecified trimester: Secondary | ICD-10-CM

## 2012-04-09 DIAGNOSIS — I10 Essential (primary) hypertension: Secondary | ICD-10-CM

## 2012-04-09 DIAGNOSIS — O10019 Pre-existing essential hypertension complicating pregnancy, unspecified trimester: Secondary | ICD-10-CM

## 2012-04-09 DIAGNOSIS — O99330 Smoking (tobacco) complicating pregnancy, unspecified trimester: Secondary | ICD-10-CM

## 2012-04-09 NOTE — Progress Notes (Signed)
She has not been able to go see the diabetes counselor due to the weather has been bad each time she had an appointment.

## 2012-04-09 NOTE — Progress Notes (Signed)
Patient doing well. Has not been able to meet with diabetic educator due to bad weather. Will reschedule that appointment. Patient otherwise doing well. Has made an effort to modify her diet. F/u growth ultrasound scheduled with MFM on 2/28

## 2012-04-22 ENCOUNTER — Encounter: Payer: Self-pay | Admitting: Family Medicine

## 2012-04-22 ENCOUNTER — Ambulatory Visit (INDEPENDENT_AMBULATORY_CARE_PROVIDER_SITE_OTHER): Payer: BC Managed Care – PPO | Admitting: Family Medicine

## 2012-04-22 VITALS — BP 143/98 | Wt 212.0 lb

## 2012-04-22 DIAGNOSIS — Z3482 Encounter for supervision of other normal pregnancy, second trimester: Secondary | ICD-10-CM

## 2012-04-22 DIAGNOSIS — O09522 Supervision of elderly multigravida, second trimester: Secondary | ICD-10-CM

## 2012-04-22 DIAGNOSIS — Z3480 Encounter for supervision of other normal pregnancy, unspecified trimester: Secondary | ICD-10-CM

## 2012-04-22 DIAGNOSIS — O1002 Pre-existing essential hypertension complicating childbirth: Secondary | ICD-10-CM

## 2012-04-22 DIAGNOSIS — O10012 Pre-existing essential hypertension complicating pregnancy, second trimester: Secondary | ICD-10-CM

## 2012-04-22 DIAGNOSIS — IMO0002 Reserved for concepts with insufficient information to code with codable children: Secondary | ICD-10-CM | POA: Insufficient documentation

## 2012-04-22 DIAGNOSIS — O09529 Supervision of elderly multigravida, unspecified trimester: Secondary | ICD-10-CM

## 2012-04-22 DIAGNOSIS — O24912 Unspecified diabetes mellitus in pregnancy, second trimester: Secondary | ICD-10-CM

## 2012-04-22 DIAGNOSIS — O2432 Unspecified pre-existing diabetes mellitus in childbirth: Secondary | ICD-10-CM

## 2012-04-22 DIAGNOSIS — O878 Other venous complications in the puerperium: Secondary | ICD-10-CM

## 2012-04-22 MED ORDER — HYDROCORTISONE ACE-PRAMOXINE 1-1 % RE FOAM: 1.0000 | Freq: Two times a day (BID) | RECTAL | Status: DC

## 2012-04-22 NOTE — Progress Notes (Signed)
Having hemorrhoids.  Having trouble sitting down.  Will try proctofoam.  Decrease constipation Cx feels closed. No Nutrition class yet, she is not checking BS.  Has no book.  Re-send to [facility] [employer] [facility].  Check HgbA1C.  Has not had fetal ECHO-will schedule. F/u growth with MFM on 2/28.

## 2012-04-22 NOTE — Addendum Note (Signed)
Addended by: Barbara Cower on: 04/22/2012 02:18 PM   Modules accepted: Orders

## 2012-04-22 NOTE — Patient Instructions (Addendum)
Following an appropriate diet and keeping your blood sugar under control is the most important thing to do for your health and that of your unborn baby.  Please check your blood sugar 4 times daily.  Please keep accurate BS logs and bring them with you to every visit.  Please bring your meter also.  Goals for Blood sugar should be: 1. Fasting (first thing in the morning before eating) should be less than 90.   2.  2 hours after meals should be less than 120.  Please eat 3 meals and 3 snacks.  Include protein (meat, dairy-cheese, eggs, nuts) with all meals.  Be mindful that carbohydrates increase your blood sugar.  Not just sweet food (cookies, cake, donuts, fruit, juice, soda) but also bread, pasta, rice, and potatoes.  You have to limit how many carbs you are eating.  Adding exercise, as little as 30 minutes a day can decrease your blood sugar. Gestational Diabetes Mellitus Gestational diabetes mellitus (GDM) is diabetes that occurs only during pregnancy. This happens when the body cannot properly handle the glucose (sugar) that increases in the blood after eating. During pregnancy, insulin resistance (reduced sensitivity to insulin) occurs because of the release of hormones from the placenta. Usually, the pancreas of pregnant women produces enough insulin to overcome the resistance that occurs. However, in gestational diabetes, the insulin is there but it does not work effectively. If the resistance is severe enough that the pancreas does not produce enough insulin, extra glucose builds up in the blood.  WHO IS AT RISK FOR DEVELOPING GESTATIONAL DIABETES?  Women with a history of diabetes in the family.  Women over age 85.  Women who are overweight.  Women in certain ethnic groups (Hispanic, African American, Native American, [place] and [place]er). WHAT CAN HAPPEN TO THE BABY? If the mother's blood glucose is too high while she is pregnant, the extra sugar will travel through the  umbilical cord to the baby. Some of the problems the baby may have are:  Large Baby - If the baby receives too much sugar, the baby will gain more weight. This may cause the baby to be too large to be born normally (vaginally) and a Cesarean section (C-section) may be needed.  Low Blood Glucose (hypoglycemia)  The baby makes extra insulin, in response to the extra sugar its gets from its mother. When the baby is born and no longer needs this extra insulin, the baby's blood glucose level may drop.  Jaundice (yellow coloring of the skin and eyes)  This is fairly common in babies. It is caused from a build-up of the chemical called bilirubin. This is rarely serious, but is seen more often in babies whose mothers had gestational diabetes. RISKS TO THE MOTHER Women who have had gestational diabetes may be at higher risk for some problems, including:  Preeclampsia or toxemia, which includes problems with high blood pressure. Blood pressure and protein levels in the urine must be checked frequently.  Infections.  Cesarean section (C-section) for delivery.  Developing Type 2 diabetes later in life. About 30-50% will develop diabetes later, especially if obese. DIAGNOSIS  The hormones that cause insulin resistance are highest at about 24-28 weeks of pregnancy. If symptoms are experienced, they are much like symptoms you would normally expect during pregnancy.  GDM is often diagnosed using a two part method: 1. After 24-28 weeks of pregnancy, the woman drinks a glucose solution and takes a blood test. If the glucose level is high, a  second test will be given. 2. Oral Glucose Tolerance Test (OGTT) which is 3 hours long  After not eating overnight, the blood glucose is checked. The woman drinks a glucose solution, and hourly blood glucose tests are taken. If the woman has risk factors for GDM, the caregiver may test earlier than 24 weeks of pregnancy. TREATMENT  Treatment of GDM is directed at keeping  the mother's blood glucose level normal, and may include:  Meal planning.  Taking insulin or other medicine to control your blood glucose level.  Exercise.  Keeping a daily record of the foods you eat.  Blood glucose monitoring and keeping a record of your blood glucose levels.  May monitor ketone levels in the urine, although this is no longer considered necessary in most pregnancies. HOME CARE INSTRUCTIONS  While you are pregnant:  Follow your caregiver's advice regarding your prenatal appointments, meal planning, exercise, medicines, vitamins, blood and other tests, and physical activities.  Keep a record of your meals, blood glucose tests, and the amount of insulin you are taking (if any). Show this to your caregiver at every prenatal visit.  If you have GDM, you may have problems with hypoglycemia (low blood glucose). You may suspect this if you become suddenly dizzy, feel shaky, and/or weak. If you think this is happening and you have a glucose meter, try to test your blood glucose level. Follow your caregiver's advice for when and how to treat your low blood glucose. Generally, the 15:15 rule is followed: Treat by consuming 15 grams of carbohydrates, wait 15 minutes, and recheck blood glucose. Examples of 15 grams of carbohydrates are:  1 cup skim or low-fat milk.   cup juice.  3-4 glucose tablets.  5-6 hard candies.  1 small box raisins.   cup regular soda pop.  Practice good hygiene, to avoid infections.  Do not smoke. SEEK MEDICAL CARE IF:   You develop abnormal vaginal discharge, with or without itching.  You become weak and tired more than expected.  You seem to sweat a lot.  You have a sudden increase in weight, 5 pounds or more in one week.  You are losing weight, 3 pounds or more in a week.  Your blood glucose level is high, and you need instructions on what to do about it. SEEK IMMEDIATE MEDICAL CARE IF:   You develop a severe headache.  You  faint or pass out.  You develop nausea and vomiting.  You become disoriented or confused.  You have a convulsion.  You develop vision problems.  You develop stomach pain.  You develop vaginal bleeding.  You develop uterine contractions.  You have leaking or a gush of fluid from the vagina. AFTER YOU HAVE THE BABY:  Go to all of your follow-up appointments, and have blood tests as advised by your caregiver.  Maintain a healthy lifestyle, to prevent diabetes in the future. This includes:  Following a healthy meal plan.  Controlling your weight.  Getting enough exercise and proper rest.  Do not smoke.  Breastfeed your baby if you can. This will lower the chance of you and your baby developing diabetes later in life. For more information about diabetes, go to the American Diabetes Association at: PMFashions.com.cy. For more information about gestational diabetes, go to the [employer] at: RentRule.com.au. Document Released: 05/21/2000 Document Revised: 05/07/2011 Document Reviewed: 12/13/2008 [facility] Patient Information 2013 [employer], [place].  Pregnancy - Second Trimester The second trimester of pregnancy (3 to 6 months)  is a period of rapid growth for you and your baby. At the end of the sixth month, your baby is about 9 inches long and weighs 1 1/2 pounds. You will begin to feel the baby move between 18 and 20 weeks of the pregnancy. This is called quickening. Weight gain is faster. A clear fluid (colostrum) may leak out of your breasts. You may feel small contractions of the womb (uterus). This is known as false labor or Braxton-Hicks contractions. This is like a practice for labor when the baby is ready to be born. Usually, the problems with morning sickness have usually passed by the end of your first trimester. Some women develop small dark blotches (called cholasma, mask of pregnancy) on their face that usually goes away  after the baby is born. Exposure to the sun makes the blotches worse. Acne may also develop in some pregnant women and pregnant women who have acne, may find that it goes away. PRENATAL EXAMS  Blood work may continue to be done during prenatal exams. These tests are done to check on your health and the probable health of your baby. Blood work is used to follow your blood levels (hemoglobin). Anemia (low hemoglobin) is common during pregnancy. Iron and vitamins are given to help prevent this. You will also be checked for diabetes between 24 and 28 weeks of the pregnancy. Some of the previous blood tests may be repeated.  The size of the uterus is measured during each visit. This is to make sure that the baby is continuing to grow properly according to the dates of the pregnancy.  Your blood pressure is checked every prenatal visit. This is to make sure you are not getting toxemia.  Your urine is checked to make sure you do not have an infection, diabetes or protein in the urine.  Your weight is checked often to make sure gains are happening at the suggested rate. This is to ensure that both you and your baby are growing normally.  Sometimes, an ultrasound is performed to confirm the proper growth and development of the baby. This is a test which bounces harmless sound waves off the baby so your caregiver can more accurately determine due dates. Sometimes, a specialized test is done on the amniotic fluid surrounding the baby. This test is called an amniocentesis. The amniotic fluid is obtained by sticking a needle into the belly (abdomen). This is done to check the chromosomes in instances where there is a concern about possible genetic problems with the baby. It is also sometimes done near the end of pregnancy if an early delivery is required. In this case, it is done to help make sure the baby's lungs are mature enough for the baby to live outside of the womb. CHANGES OCCURING IN THE SECOND TRIMESTER  OF PREGNANCY Your body goes through many changes during pregnancy. They vary from person to person. Talk to your caregiver about changes you notice that you are concerned about.  During the second trimester, you will likely have an increase in your appetite. It is normal to have cravings for certain foods. This varies from person to person and pregnancy to pregnancy.  Your lower abdomen will begin to bulge.  You may have to urinate more often because the uterus and baby are pressing on your bladder. It is also common to get more bladder infections during pregnancy (pain with urination). You can help this by drinking lots of fluids and emptying your bladder before and after intercourse.  You may begin to get stretch marks on your hips, abdomen, and breasts. These are normal changes in the body during pregnancy. There are no exercises or medications to take that prevent this change.  You may begin to develop swollen and bulging veins (varicose veins) in your legs. Wearing support hose, elevating your feet for 15 minutes, 3 to 4 times a day and limiting salt in your diet helps lessen the problem.  Heartburn may develop as the uterus grows and pushes up against the stomach. Antacids recommended by your caregiver helps with this problem. Also, eating smaller meals 4 to 5 times a day helps.  Constipation can be treated with a stool softener or adding bulk to your diet. Drinking lots of fluids, vegetables, fruits, and whole grains are helpful.  Exercising is also helpful. If you have been very active up until your pregnancy, most of these activities can be continued during your pregnancy. If you have been less active, it is helpful to start an exercise program such as walking.  Hemorrhoids (varicose veins in the rectum) may develop at the end of the second trimester. Warm sitz baths and hemorrhoid cream recommended by your caregiver helps hemorrhoid problems.  Backaches may develop during this time of  your pregnancy. Avoid heavy lifting, wear low heal shoes and practice good posture to help with backache problems.  Some pregnant women develop tingling and numbness of their hand and fingers because of swelling and tightening of ligaments in the wrist (carpel tunnel syndrome). This goes away after the baby is born.  As your breasts enlarge, you may have to get a bigger bra. Get a comfortable, cotton, support bra. Do not get a nursing bra until the last month of the pregnancy if you will be nursing the baby.  You may get a dark line from your belly button to the pubic area called the linea nigra.  You may develop rosy cheeks because of increase blood flow to the face.  You may develop spider looking lines of the face, neck, arms and chest. These go away after the baby is born. HOME CARE INSTRUCTIONS   It is extremely important to avoid all smoking, herbs, alcohol, and unprescribed drugs during your pregnancy. These chemicals affect the formation and growth of the baby. Avoid these chemicals throughout the pregnancy to ensure the delivery of a healthy infant.  Most of your home care instructions are the same as suggested for the first trimester of your pregnancy. Keep your caregiver's appointments. Follow your caregiver's instructions regarding medication use, exercise and diet.  During pregnancy, you are providing food for you and your baby. Continue to eat regular, well-balanced meals. Choose foods such as meat, fish, milk and other low fat dairy products, vegetables, fruits, and whole-grain breads and cereals. Your caregiver will tell you of the ideal weight gain.  A physical sexual relationship may be continued up until near the end of pregnancy if there are no other problems. Problems could include early (premature) leaking of amniotic fluid from the membranes, vaginal bleeding, abdominal pain, or other medical or pregnancy problems.  Exercise regularly if there are no restrictions. Check  with your caregiver if you are unsure of the safety of some of your exercises. The greatest weight gain will occur in the last 2 trimesters of pregnancy. Exercise will help you:  Control your weight.  Get you in shape for labor and delivery.  Lose weight after you have the baby.  Wear a good support or jogging bra  for breast tenderness during pregnancy. This may help if worn during sleep. Pads or tissues may be used in the bra if you are leaking colostrum.  Do not use hot tubs, steam rooms or saunas throughout the pregnancy.  Wear your seat belt at all times when driving. This protects you and your baby if you are in an accident.  Avoid raw meat, uncooked cheese, cat litter boxes and soil used by cats. These carry germs that can cause birth defects in the baby.  The second trimester is also a good time to visit your dentist for your dental health if this has not been done yet. Getting your teeth cleaned is OK. Use a soft toothbrush. Brush gently during pregnancy.  It is easier to loose urine during pregnancy. Tightening up and strengthening the pelvic muscles will help with this problem. Practice stopping your urination while you are going to the bathroom. These are the same muscles you need to strengthen. It is also the muscles you would use as if you were trying to stop from passing gas. You can practice tightening these muscles up 10 times a set and repeating this about 3 times per day. Once you know what muscles to tighten up, do not perform these exercises during urination. It is more likely to contribute to an infection by backing up the urine.  Ask for help if you have financial, counseling or nutritional needs during pregnancy. Your caregiver will be able to offer counseling for these needs as well as refer you for other special needs.  Your skin may become oily. If so, wash your face with mild soap, use non-greasy moisturizer and oil or cream based makeup. MEDICATIONS AND DRUG USE IN  PREGNANCY  Take prenatal vitamins as directed. The vitamin should contain 1 milligram of folic acid. Keep all vitamins out of reach of children. Only a couple vitamins or tablets containing iron may be fatal to a baby or young child when ingested.  Avoid use of all medications, including herbs, over-the-counter medications, not prescribed or suggested by your caregiver. Only take over-the-counter or prescription medicines for pain, discomfort, or fever as directed by your caregiver. Do not use aspirin.  Let your caregiver also know about herbs you may be using.  Alcohol is related to a number of birth defects. This includes fetal alcohol syndrome. All alcohol, in any form, should be avoided completely. Smoking will cause low birth rate and premature babies.  Street or illegal drugs are very harmful to the baby. They are absolutely forbidden. A baby born to an addicted mother will be addicted at birth. The baby will go through the same withdrawal an adult does. SEEK MEDICAL CARE IF:  You have any concerns or worries during your pregnancy. It is better to call with your questions if you feel they cannot wait, rather than worry about them. SEEK IMMEDIATE MEDICAL CARE IF:   An unexplained oral temperature above 102 F (38.9 C) develops, or as your caregiver suggests.  You have leaking of fluid from the vagina (birth canal). If leaking membranes are suspected, take your temperature and tell your caregiver of this when you call.  There is vaginal spotting, bleeding, or passing clots. Tell your caregiver of the amount and how many pads are used. Light spotting in pregnancy is common, especially following intercourse.  You develop a bad smelling vaginal discharge with a change in the color from clear to white.  You continue to feel sick to your stomach (nauseated) and have  no relief from remedies suggested. You vomit blood or coffee ground-like materials.  You lose more than 2 pounds of weight or  gain more than 2 pounds of weight over 1 week, or as suggested by your caregiver.  You notice swelling of your face, hands, feet, or legs.  You get exposed to [place] measles and have never had them.  You are exposed to fifth disease or chickenpox.  You develop belly (abdominal) pain. Round ligament discomfort is a common non-cancerous (benign) cause of abdominal pain in pregnancy. Your caregiver still must evaluate you.  You develop a bad headache that does not go away.  You develop fever, diarrhea, pain with urination, or shortness of breath.  You develop visual problems, blurry, or double vision.  You fall or are in a car accident or any kind of trauma.  There is mental or physical violence at home. Document Released: 02/06/2001 Document Revised: 05/07/2011 Document Reviewed: 08/11/2008 [facility] Patient Information 2013 [place], [place].  Breastfeeding Deciding to breastfeed is one of the best choices you can make for you and your baby. The information that follows gives a brief overview of the benefits of breastfeeding as well as common topics surrounding breastfeeding. BENEFITS OF BREASTFEEDING For the baby  The first milk (colostrum) helps the baby's digestive system function better.   There are antibodies in the mother's milk that help the baby fight off infections.   The baby has a lower incidence of asthma, allergies, and sudden infant death syndrome (SIDS).   The nutrients in breast milk are better for the baby than infant formulas, and breast milk helps the baby's brain grow better.   Babies who breastfeed have less gas, colic, and constipation.  For the mother  Breastfeeding helps develop a very special bond between the mother and her baby.   Breastfeeding is convenient, always available at the correct temperature, and costs nothing.   Breastfeeding burns calories in the mother and helps her lose weight that was gained during pregnancy.   Breastfeeding  makes the uterus contract back down to normal size faster and slows bleeding following delivery.   Breastfeeding mothers have a lower risk of developing breast cancer.  BREASTFEEDING FREQUENCY  A healthy, full-term baby may breastfeed as often as every hour or space his or her feedings to every 3 hours.   Watch your baby for signs of hunger. Nurse your baby if he or she shows signs of hunger. How often you nurse will vary from baby to baby.   Nurse as often as the baby requests, or when you feel the need to reduce the fullness of your breasts.   Awaken the baby if it has been 3 4 hours since the last feeding.   Frequent feeding will help the mother make more milk and will help prevent problems, such as sore nipples and engorgement of the breasts.  BABY'S POSITION AT THE BREAST  Whether lying down or sitting, be sure that the baby's tummy is facing your tummy.   Support the breast with 4 fingers underneath the breast and the thumb above. Make sure your fingers are well away from the nipple and baby's mouth.   Stroke the baby's lips gently with your finger or nipple.   When the baby's mouth is open wide enough, place all of your nipple and as much of the areola as possible into your baby's mouth.   Pull the baby in close so the tip of the nose and the baby's cheeks touch the breast  during the feeding.  FEEDINGS AND SUCTION  The length of each feeding varies from baby to baby and from feeding to feeding.   The baby must suck about 2 3 minutes for your milk to get to him or her. This is called a "let down." For this reason, allow the baby to feed on each breast as long as he or she wants. Your baby will end the feeding when he or she has received the right balance of nutrients.   To break the suction, put your finger into the corner of the baby's mouth and slide it between his or her gums before removing your breast from his or her mouth. This will help prevent sore nipples.   HOW TO TELL WHETHER YOUR BABY IS GETTING ENOUGH BREAST MILK. Wondering whether or not your baby is getting enough milk is a common concern among mothers. You can be assured that your baby is getting enough milk if:   Your baby is actively sucking and you hear swallowing.   Your baby seems relaxed and satisfied after a feeding.   Your baby nurses at least 8 12 times in a 24 hour time period. Nurse your baby until he or she unlatches or falls asleep at the first breast (at least 10 20 minutes), then offer the second side.   Your baby is wetting 5 6 disposable diapers (6 8 cloth diapers) in a 24 hour period by 59 71 days of age.   Your baby is having at least 3 4 stools every 24 hours for the first 6 weeks. The stool should be soft and yellow.   Your baby should gain 4 7 ounces per week after he or she is 17 days old.   Your breasts feel softer after nursing.  REDUCING BREAST ENGORGEMENT  In the first week after your baby is born, you may experience signs of breast engorgement. When breasts are engorged, they feel heavy, warm, full, and may be tender to the touch. You can reduce engorgement if you:   Nurse frequently, every 2 3 hours. Mothers who breastfeed early and often have fewer problems with engorgement.   Place light ice packs on your breasts for 10 20 minutes between feedings. This reduces swelling. Wrap the ice packs in a lightweight towel to protect your skin. Bags of frozen vegetables work well for this purpose.   Take a warm shower or apply warm, moist heat to your breast for 5 10 minutes just before each feeding. This increases circulation and helps the milk flow.   Gently massage your breast before and during the feeding. Using your finger tips, massage from the chest wall towards your nipple in a circular motion.   Make sure that the baby empties at least one breast at every feeding before switching sides.   Use a breast pump to empty the breasts if your baby is  sleepy or not nursing well. You may also want to pump if you are returning to work oryou feel you are getting engorged.   Avoid bottle feeds, pacifiers, or supplemental feedings of water or juice in place of breastfeeding. Breast milk is all the food your baby needs. It is not necessary for your baby to have water or formula. In fact, to help your breasts make more milk, it is best not to give your baby supplemental feedings during the early weeks.   Be sure the baby is latched on and positioned properly while breastfeeding.   Wear a supportive bra, avoiding  underwire styles.   Eat a balanced diet with enough fluids.   Rest often, relax, and take your prenatal vitamins to prevent fatigue, stress, and anemia.  If you follow these suggestions, your engorgement should improve in 24 48 hours. If you are still experiencing difficulty, call your lactation consultant or caregiver.  CARING FOR YOURSELF Take care of your breasts  Bathe or shower daily.   Avoid using soap on your nipples.   Start feedings on your left breast at one feeding and on your right breast at the next feeding.   You will notice an increase in your milk supply 2 5 days after delivery. You may feel some discomfort from engorgement, which makes your breasts very firm and often tender. Engorgement "peaks" out within 24 48 hours. In the meantime, apply warm moist towels to your breasts for 5 10 minutes before feeding. Gentle massage and expression of some milk before feeding will soften your breasts, making it easier for your baby to latch on.   Wear a well-fitting nursing bra, and air dry your nipples for a 3 after each feeding.   Only use cotton bra pads.   Only use pure lanolin on your nipples after nursing. You do not need to wash it off before feeding the baby again. Another option is to express a few drops of breast milk and gently massage it into your nipples.  Take care of yourself  Eat  well-balanced meals and nutritious snacks.   Drinking milk, fruit juice, and water to satisfy your thirst (about 8 glasses a day).   Get plenty of rest.  Avoid foods that you notice affect the baby in a bad way.  SEEK MEDICAL CARE IF:   You have difficulty with breastfeeding and need help.   You have a hard, red, sore area on your breast that is accompanied by a fever.   Your baby is too sleepy to eat well or is having trouble sleeping.   Your baby is wetting less than 6 diapers a day, by 65 days of age.   Your baby's skin or white part of his or her eyes is more yellow than it was in the hospital.   You feel depressed.  Document Released: 02/12/2005 Document Revised: 08/14/2011 Document Reviewed: 05/13/2011 [facility] Patient Information 2013 [place], [place].

## 2012-04-22 NOTE — Progress Notes (Signed)
Patient is having increased pain with hemrhoids, she is using otc meds with no relief and is having trouble even sitting.  She has not taken her bp meds yet today.

## 2012-04-23 ENCOUNTER — Ambulatory Visit: Payer: BC Managed Care – PPO | Admitting: Obstetrics and Gynecology

## 2012-04-23 ENCOUNTER — Ambulatory Visit (INDEPENDENT_AMBULATORY_CARE_PROVIDER_SITE_OTHER): Payer: BC Managed Care – PPO | Admitting: Obstetrics and Gynecology

## 2012-04-23 ENCOUNTER — Encounter: Payer: Self-pay | Admitting: Obstetrics and Gynecology

## 2012-04-23 VITALS — BP 131/87 | Wt 212.0 lb

## 2012-04-23 DIAGNOSIS — O99332 Smoking (tobacco) complicating pregnancy, second trimester: Secondary | ICD-10-CM

## 2012-04-23 DIAGNOSIS — I10 Essential (primary) hypertension: Secondary | ICD-10-CM

## 2012-04-23 DIAGNOSIS — O09529 Supervision of elderly multigravida, unspecified trimester: Secondary | ICD-10-CM

## 2012-04-23 DIAGNOSIS — O09522 Supervision of elderly multigravida, second trimester: Secondary | ICD-10-CM

## 2012-04-23 DIAGNOSIS — O1002 Pre-existing essential hypertension complicating childbirth: Secondary | ICD-10-CM

## 2012-04-23 DIAGNOSIS — Z3482 Encounter for supervision of other normal pregnancy, second trimester: Secondary | ICD-10-CM

## 2012-04-23 DIAGNOSIS — O99334 Smoking (tobacco) complicating childbirth: Secondary | ICD-10-CM

## 2012-04-23 DIAGNOSIS — O2432 Unspecified pre-existing diabetes mellitus in childbirth: Secondary | ICD-10-CM

## 2012-04-23 DIAGNOSIS — O878 Other venous complications in the puerperium: Secondary | ICD-10-CM

## 2012-04-23 DIAGNOSIS — O10012 Pre-existing essential hypertension complicating pregnancy, second trimester: Secondary | ICD-10-CM

## 2012-04-23 DIAGNOSIS — O24912 Unspecified diabetes mellitus in pregnancy, second trimester: Secondary | ICD-10-CM

## 2012-04-23 DIAGNOSIS — Z3480 Encounter for supervision of other normal pregnancy, unspecified trimester: Secondary | ICD-10-CM

## 2012-04-23 LAB — HEMOGLOBIN A1C
Hgb A1c MFr Bld: 6.4 % — ABNORMAL HIGH (ref <5.7)
Mean Plasma Glucose: 137 mg/dL — ABNORMAL HIGH (ref <117)

## 2012-04-23 MED ORDER — DOCUSATE SODIUM 100 MG PO CAPS: 100.0000 mg | ORAL_CAPSULE | Freq: Two times a day (BID) | ORAL | Status: DC

## 2012-04-23 NOTE — Progress Notes (Signed)
Patient presenting today with worsening pain from hemorrhoids. Visible hemorrhoid at 12 o'clock position, slightly tender to touch. Advised to continue proctofoam, preparation- H and warm compresses. Referral to gen surgery provided. Patient advised to go to ED if pain worsens prior to referral appointment.

## 2012-04-23 NOTE — Progress Notes (Signed)
Hemorrhoids are feeling even worse than yesterday, meds not helping at all, she is unable to sleep.

## 2012-04-24 ENCOUNTER — Ambulatory Visit (INDEPENDENT_AMBULATORY_CARE_PROVIDER_SITE_OTHER): Payer: BC Managed Care – PPO | Admitting: General Surgery

## 2012-04-24 ENCOUNTER — Encounter (INDEPENDENT_AMBULATORY_CARE_PROVIDER_SITE_OTHER): Payer: Self-pay | Admitting: General Surgery

## 2012-04-24 ENCOUNTER — Other Ambulatory Visit: Payer: Self-pay | Admitting: Family Medicine

## 2012-04-24 VITALS — BP 120/82 | HR 90 | Temp 97.0°F | Resp 20 | Ht 60.0 in | Wt 206.0 lb

## 2012-04-24 DIAGNOSIS — K649 Unspecified hemorrhoids: Secondary | ICD-10-CM

## 2012-04-24 DIAGNOSIS — O09529 Supervision of elderly multigravida, unspecified trimester: Secondary | ICD-10-CM

## 2012-04-24 MED ORDER — LIDOCAINE HCL 2 % EX GEL: CUTANEOUS | Status: DC

## 2012-04-24 NOTE — Progress Notes (Signed)
Subjective:     Patient ID: Brittany Grant, female   DOB: 12/15/1970, 42 y.o.   MRN: [id number]  HPI The patient is a 42 year old female who is referred by Dr. Jolayne Panther for hemorrhoids. Patient is [redacted] weeks pregnant.  The patient states she's had issues with hemorrhoids and has been using ProctoFoam without relief. She states that her pain is relieved after standing and walking, however she's having issues with constipation and having bowel movements.  Review of Systems  Constitutional: Negative.   HENT: Negative.   Eyes: Negative.   Respiratory: Negative.   Cardiovascular: Negative.   Gastrointestinal: Negative.   Endocrine: Negative.   Neurological: Negative.        Objective:   Physical Exam  Constitutional: She is oriented to person, place, and time. She appears well-developed and well-nourished.  HENT:  Head: Normocephalic and atraumatic.  Eyes: Conjunctivae and EOM are normal. Pupils are equal, round, and reactive to light.  Neck: Normal range of motion. Neck supple.  Cardiovascular: Normal rate, regular rhythm and normal heart sounds.   Pulmonary/Chest: Effort normal and breath sounds normal.  Abdominal: Soft. Bowel sounds are normal. She exhibits no distension and no mass. There is no tenderness. There is no rebound and no guarding.  Genitourinary:     Musculoskeletal: Normal range of motion.  Neurological: She is alert and oriented to person, place, and time.       Assessment:     42 year old female [redacted] weeks gestation, with external hemorrhoids.     Plan:     Had a long discussion with the patient and discussed the anatomy and physiology of hemorrhoids and the need to have regular soft bowel movements. We discussed placing the patient on bowel regimen with stool softeners as well as fiber.   Will treat patient symptomatically and avoid any surgical correction of her hemorrhoids, the fact that delivery of the child will likely resolve her hemorrhoids. 1.  Prescription for lidocaine jelly, pain medication, 2.  I encouraged her to take 30 cc of milk of magnesia, or a bottle of mag citrate to help with inducing a bowel movement. 3.  We discussed the plan lidocaine jelly approximately 30 minutes prior to having a bowel movement pain. 4. We discussed continuing with a hemorrhoid donutto help with pain while sitting. 5.  She can f/u PRN

## 2012-04-25 ENCOUNTER — Ambulatory Visit (HOSPITAL_COMMUNITY)
Admission: RE | Admit: 2012-04-25 | Discharge: 2012-04-25 | Disposition: A | Discharge status: Home / Self Care | Payer: BC Managed Care – PPO | Source: Ambulatory Visit | Attending: Family Medicine | Admitting: Family Medicine

## 2012-04-25 VITALS — BP 114/71 | HR 70 | Wt 205.0 lb

## 2012-04-25 DIAGNOSIS — O09529 Supervision of elderly multigravida, unspecified trimester: Secondary | ICD-10-CM

## 2012-04-25 DIAGNOSIS — O99332 Smoking (tobacco) complicating pregnancy, second trimester: Secondary | ICD-10-CM

## 2012-04-25 DIAGNOSIS — I10 Essential (primary) hypertension: Secondary | ICD-10-CM

## 2012-04-25 DIAGNOSIS — O10019 Pre-existing essential hypertension complicating pregnancy, unspecified trimester: Secondary | ICD-10-CM | POA: Insufficient documentation

## 2012-04-25 DIAGNOSIS — O99810 Abnormal glucose complicating pregnancy: Secondary | ICD-10-CM | POA: Insufficient documentation

## 2012-04-25 NOTE — Progress Notes (Signed)
Brittany Grant  was seen today for an ultrasound appointment.  See full report in [facility].  Impression: Single IUP at 24+0  weeks Normal interval anatomy.  The spine was visualized today and appears normal Interval growth is appropriate (55th %tile) Normal amniotic fluid volume  Recommendations: Recommend follow-up ultrasound examination in 4 weeks for interval growth  Alpha Gula, MD

## 2012-05-01 ENCOUNTER — Ambulatory Visit (HOSPITAL_COMMUNITY): Payer: BC Managed Care – PPO | Attending: Obstetrics and Gynecology

## 2012-05-01 ENCOUNTER — Other Ambulatory Visit: Payer: Self-pay | Admitting: Obstetrics and Gynecology

## 2012-05-06 ENCOUNTER — Ambulatory Visit (INDEPENDENT_AMBULATORY_CARE_PROVIDER_SITE_OTHER): Payer: BC Managed Care – PPO | Admitting: Obstetrics and Gynecology

## 2012-05-06 VITALS — BP 121/86 | Wt 206.0 lb

## 2012-05-06 DIAGNOSIS — Z3480 Encounter for supervision of other normal pregnancy, unspecified trimester: Secondary | ICD-10-CM

## 2012-05-06 DIAGNOSIS — O24912 Unspecified diabetes mellitus in pregnancy, second trimester: Secondary | ICD-10-CM

## 2012-05-06 DIAGNOSIS — O2432 Unspecified pre-existing diabetes mellitus in childbirth: Secondary | ICD-10-CM

## 2012-05-06 DIAGNOSIS — O10012 Pre-existing essential hypertension complicating pregnancy, second trimester: Secondary | ICD-10-CM

## 2012-05-06 DIAGNOSIS — Z3482 Encounter for supervision of other normal pregnancy, second trimester: Secondary | ICD-10-CM

## 2012-05-06 DIAGNOSIS — I10 Essential (primary) hypertension: Secondary | ICD-10-CM

## 2012-05-06 DIAGNOSIS — O1002 Pre-existing essential hypertension complicating childbirth: Secondary | ICD-10-CM

## 2012-05-06 DIAGNOSIS — O09529 Supervision of elderly multigravida, unspecified trimester: Secondary | ICD-10-CM

## 2012-05-06 DIAGNOSIS — O09522 Supervision of elderly multigravida, second trimester: Secondary | ICD-10-CM

## 2012-05-06 DIAGNOSIS — O878 Other venous complications in the puerperium: Secondary | ICD-10-CM

## 2012-05-06 MED ORDER — GLYBURIDE 2.5 MG PO TABS: 2.5000 mg | ORAL_TABLET | Freq: Every day | ORAL | Status: DC

## 2012-05-06 NOTE — Progress Notes (Signed)
Constipation, is taking stool softener and fiber supplements no bowel movements x 3 weeks.  Has constant urge to have bowel movement but cannot go.

## 2012-05-06 NOTE — Progress Notes (Signed)
Patient doing well. She reports that her hemorrhoids are not bothering her anymore. She has not had a bowel movement in 1 week and has not tried any over the counter laxative for fear of harming the fetus. Advised that it was safe to use laxative at this time, to help to have bowel movement. She should also increase her fiber intake thereafter. Patient has not met with diabetic educator- will need to reschedule that appointment. Patient has been checking CBG and fasting are in 130's range. 2 hr postprandial are mainly in 130's with highest value of 179. Encouraged patient to continue monitoring CBG 4 times daily. Will start glyburide 2.5 mg q HS. Will also ensure that patient is scheduled for fetal echo.

## 2012-05-07 ENCOUNTER — Encounter: Payer: BC Managed Care – PPO | Admitting: Family Medicine

## 2012-05-23 ENCOUNTER — Ambulatory Visit (HOSPITAL_COMMUNITY)
Admission: RE | Admit: 2012-05-23 | Discharge: 2012-05-23 | Disposition: A | Discharge status: Home / Self Care | Payer: BC Managed Care – PPO | Source: Ambulatory Visit | Attending: Obstetrics & Gynecology | Admitting: Obstetrics & Gynecology

## 2012-05-23 VITALS — BP 130/87 | HR 89 | Wt 207.5 lb

## 2012-05-23 DIAGNOSIS — O99810 Abnormal glucose complicating pregnancy: Secondary | ICD-10-CM | POA: Insufficient documentation

## 2012-05-23 DIAGNOSIS — I10 Essential (primary) hypertension: Secondary | ICD-10-CM

## 2012-05-23 DIAGNOSIS — O99332 Smoking (tobacco) complicating pregnancy, second trimester: Secondary | ICD-10-CM

## 2012-05-23 DIAGNOSIS — O10019 Pre-existing essential hypertension complicating pregnancy, unspecified trimester: Secondary | ICD-10-CM | POA: Insufficient documentation

## 2012-05-23 DIAGNOSIS — O09529 Supervision of elderly multigravida, unspecified trimester: Secondary | ICD-10-CM | POA: Insufficient documentation

## 2012-05-23 DIAGNOSIS — O24913 Unspecified diabetes mellitus in pregnancy, third trimester: Secondary | ICD-10-CM

## 2012-05-23 NOTE — Progress Notes (Signed)
Brittany Grant was seen for ultrasound appointment today.  Please see [facility] report for details.

## 2012-05-29 ENCOUNTER — Encounter: Payer: BC Managed Care – PPO | Admitting: Obstetrics & Gynecology

## 2012-05-29 ENCOUNTER — Ambulatory Visit (INDEPENDENT_AMBULATORY_CARE_PROVIDER_SITE_OTHER): Payer: BC Managed Care – PPO | Admitting: Obstetrics & Gynecology

## 2012-05-29 VITALS — BP 115/90 | Wt 206.0 lb

## 2012-05-29 DIAGNOSIS — O2432 Unspecified pre-existing diabetes mellitus in childbirth: Secondary | ICD-10-CM

## 2012-05-29 DIAGNOSIS — O10013 Pre-existing essential hypertension complicating pregnancy, third trimester: Secondary | ICD-10-CM

## 2012-05-29 DIAGNOSIS — I10 Essential (primary) hypertension: Secondary | ICD-10-CM

## 2012-05-29 DIAGNOSIS — O09523 Supervision of elderly multigravida, third trimester: Secondary | ICD-10-CM

## 2012-05-29 DIAGNOSIS — O24913 Unspecified diabetes mellitus in pregnancy, third trimester: Secondary | ICD-10-CM

## 2012-05-29 DIAGNOSIS — Z23 Encounter for immunization: Secondary | ICD-10-CM

## 2012-05-29 DIAGNOSIS — Z3480 Encounter for supervision of other normal pregnancy, unspecified trimester: Secondary | ICD-10-CM

## 2012-05-29 DIAGNOSIS — O09529 Supervision of elderly multigravida, unspecified trimester: Secondary | ICD-10-CM

## 2012-05-29 DIAGNOSIS — O10019 Pre-existing essential hypertension complicating pregnancy, unspecified trimester: Secondary | ICD-10-CM

## 2012-05-29 LAB — CBC
Hemoglobin: 10.9 g/dL — ABNORMAL LOW (ref 12.0–15.0)
MCH: 22.1 pg — ABNORMAL LOW (ref 26.0–34.0)
MCHC: 32.1 g/dL (ref 30.0–36.0)
Platelets: 226 10*3/uL (ref 150–400)

## 2012-05-29 LAB — HEMOGLOBIN A1C
Hgb A1c MFr Bld: 6.2 % — ABNORMAL HIGH (ref <5.7)
Mean Plasma Glucose: 131 mg/dL — ABNORMAL HIGH (ref <117)

## 2012-05-29 MED ORDER — GLYBURIDE 5 MG PO TABS: 5.0000 mg | ORAL_TABLET | Freq: Every day | ORAL | Status: DC

## 2012-05-29 MED ORDER — TETANUS-DIPHTH-ACELL PERTUSSIS 5-2.5-18.5 LF-MCG/0.5 IM SUSP: 0.5000 mL | Freq: Once | INTRAMUSCULAR | Status: DC

## 2012-05-29 NOTE — Progress Notes (Signed)
Patient had fetal ECHO today; will follow up results and manage accordingly. Forgot log book but reports Fastings 95-120s; 2 hr PP 118-135.  Increased Glyburide to 5 mg po qhs, will reevaluate in one week.  Recommended third trimester labs; she declines HIV and RPR testing, but agrees to CBC and TDaP vaccine.  DBP 90 today, no symptoms, continue to monitor. No other complaints or concerns.  Fetal movement and labor precautions reviewed.

## 2012-05-29 NOTE — Patient Instructions (Signed)
Return to clinic for any obstetric concerns or go to [facility] for evaluation Tetanus, Diphtheria (Td) or Tetanus, Diphtheria, Pertussis (Tdap) Vaccine What You Need to Know WHY GET VACCINATED? Tetanus, diphtheria and pertussis can be very serious diseases. TETANUS (Lockjaw) causes painful muscle spasms and stiffness, usually all over the body.  Tetanus can lead to tightening of muscles in the head and neck so the victim cannot open his mouth or swallow, or sometimes even breathe. Tetanus kills about 1 out of 5 people who are infected. DIPHTHERIA can cause a thick membrane to cover the back of the throat.  Diphtheria can lead to breathing problems, paralysis, heart failure, and even death. PERTUSSIS (Whooping Cough) causes severe coughing spells which can lead to difficulty breathing, vomiting, and disturbed sleep.  Pertussis can lead to weight loss, incontinence, rib fractures and passing out from violent coughing. Up to 2 in 100 adolescents and 5 in 100 adults with pertussis are hospitalized or have complications, including pneumonia and death. These 3 diseases are all caused by bacteria. Diphtheria and pertussis are spread from person to person. Tetanus enters the body through cuts, scratches, or wounds. The [place] saw as many as 200,000 cases a year of diphtheria and pertussis before vaccines were available, and hundreds of cases of tetanus. Since then, tetanus and diphtheria cases have dropped by about 99% and pertussis cases by about 92%. Children 22 years of age and younger get DTaP vaccine to protect them from these three diseases. But older children, adolescents, and adults need protection too. VACCINES FOR ADOLESCENTS AND ADULTS: TD AND TDAP Two vaccines are available to protect people 36 years of age and older from these diseases:  Td vaccine has been used for many years. It protects against tetanus and diphtheria.  Tdap vaccine was licensed in 2005. It is the first vaccine for  adolescents and adults that protects against pertussis as well as tetanus and diphtheria. A Td booster dose is recommended every 10 years. Tdap is given only once. WHICH VACCINE, AND WHEN? Ages 7 through 18 years  A dose of Tdap is recommended at age 72 or 25. This dose could be given as early as age 62 for children who missed one or more childhood doses of DTaP.  Children and adolescents who did not get a complete series of DTaP shots by age 59 should complete the series using a combination of Td and Tdap. Age 19 years and Older  All adults should get a booster dose of Td every 10 years. Adults under 65 who have never gotten Tdap should get a dose of Tdap as their next booster dose. Adults 65 and older may get one booster dose of Tdap.  Adults (including women who may become pregnant and adults 76 and older) who expect to have close contact with a baby younger than 15 months of age should get a dose of Tdap to help protect the baby from pertussis.  Healthcare professionals who have direct patient contact in hospitals or clinics should get one dose of Tdap. Protection After a Wound  A person who gets a severe cut or burn might need a dose of Td or Tdap to prevent tetanus infection. Tdap should be used for anyone who has never had a dose previously. Td should be used if Tdap is not available, or for:  Anybody who has already had a dose of Tdap.  Children 7 through 75 years of age who completed the childhood DTaP series.  Adults 65 and older.  Pregnant Women  Pregnant women who have never had a dose of Tdap should get one, after the 20th week of gestation and preferably during the 3rd trimester. If they do not get Tdap during their pregnancy they should get a dose as soon as possible after delivery. Pregnant women who have previously received Tdap and need tetanus or diphtheria vaccine while pregnant should get Td. Tdap and Td may be given at the same time as other vaccines. SOME PEOPLE SHOULD  NOT BE VACCINATED OR SHOULD WAIT  Anyone who has had a life-threatening allergic reaction after a dose of any tetanus, diphtheria, or pertussis containing vaccine should not get Td or Tdap.  Anyone who has a severe allergy to any component of a vaccine should not get that vaccine. Tell your doctor if the person getting the vaccine has any severe allergies.  Anyone who had a coma, or long or multiple seizures within 7 days after a dose of DTP or DTaP should not get Tdap, unless a cause other than the vaccine was found. These people may get Td.  Talk to your doctor if the person getting either vaccine:  Has epilepsy or another nervous system problem.  Had severe swelling or severe pain after a previous dose of DTP, DTaP, DT, Td, or Tdap vaccine.  Has had Guillain Barr Syndrome (GBS). Anyone who has a moderate or severe illness on the day the shot is scheduled should usually wait until they recover before getting Tdap or Td vaccine. A person with a mild illness or low fever can usually be vaccinated. WHAT ARE THE RISKS FROM TDAP AND TD VACCINES? With a vaccine, as with any medicine, there is always a small risk of a life-threatening allergic reaction or other serious problem. Brief fainting spells and related symptoms (such as jerking movements) can happen after any medical procedure, including vaccination. Sitting or lying down for about 15 minutes after a vaccination can help prevent fainting and injuries caused by falls. Tell your doctor if the patient feels dizzy or lightheaded, or has vision changes or ringing in the ears. Getting tetanus, diphtheria, or pertussis would be much more likely to lead to severe problems than getting either Td or Tdap vaccine. Problems reported after Td and Tdap vaccines are listed below. Mild Problems (noticeable, but did not interfere with activities) Tdap  Pain (about 3 in 4 adolescents and 2 in 3 adults).  Redness or swelling (about 1 in 5).  Mild fever  of at least 100.4 F (38 C) (up to about 1 in 25 adolescents and 1 in 100 adults).  Headache (about 4 in 10 adolescents and 3 in 10 adults).  Tiredness (about 1 in 3 adolescents and 1 in 4 adults).  Nausea, vomiting, diarrhea, or stomach ache (up to 1 in 4 adolescents and 1 in 10 adults).  Chills, body aches, sore joints, rash, or swollen glands (uncommon). Td  Pain (up to about 8 in 10).  Redness or swelling at the injection site (up to about 1 in 3).  Mild fever (up to about 1 in 15).  Headache or tiredness (uncommon). Moderate Problems (interfered with activities, but did not require medical attention) Tdap  Pain at the injection site (about 1 in 20 adolescents and 1 in 100 adults).  Redness or swelling at the injection site (up to about 1 in 16 adolescents and 1 in 25 adults).  Fever over 102 F (38.9 C) (about 1 in 100 adolescents and 1 in 250 adults).  Headache (  1 in 300).  Nausea, vomiting, diarrhea, or stomach ache (up to 3 in 100 adolescents and 1 in 100 adults). Td  Fever over 102 F (38.9 C) (rare). Tdap or Td  Extensive swelling of the arm where the shot was given (up to about 3 in 100). Severe Problems (Unable to perform usual activities; required medical attention) Tdap or Td  Swelling, severe pain, bleeding, and redness in the arm where the shot was given (rare). A severe allergic reaction could occur after any vaccine. They are estimated to occur less than once in a million doses. WHAT IF THERE IS A SEVERE REACTION? What should I look for? Any unusual condition, such as a severe allergic reaction or a high fever. If a severe allergic reaction occurred, it would be within a few minutes to an hour after the shot. Signs of a serious allergic reaction can include difficulty breathing, weakness, hoarseness or wheezing, a fast heartbeat, hives, dizziness, paleness, or swelling of the throat. What should I do?  Call a doctor, or get the person to a doctor  right away.  Tell your doctor what happened, the date and time it happened, and when the vaccination was given.  Ask your provider to report the reaction by filing a Vaccine Adverse Event Reporting System (VAERS) form. Or, you can file this report through the VAERS website at www.vaers.LAgents.no or by calling 1-2183876591. VAERS does not provide medical advice. THE NATIONAL VACCINE INJURY COMPENSATION PROGRAM The National Vaccine Injury Compensation Program (VICP) was created in 1986. Persons who believe they may have been injured by a vaccine can learn about the program and about filing a claim by calling 1-573-205-1979 or visiting the VICP website at SpiritualWord.at. HOW CAN I LEARN MORE?  Your doctor can give you the vaccine package insert or suggest other sources of information.  Call your local or state health department.  Contact the Centers for Disease Control and Prevention (CDC):  Call (971)015-0137 (1-800-CDC-INFO).  Visit the [facility] website at PicCapture.uy. CDC Td and Tdap Interim VIS (03/21/10) Document Released: 12/10/2005 Document Revised: 05/07/2011 Document Reviewed: 03/21/2010 [facility] Patient Information 2013 [place], [place].

## 2012-05-29 NOTE — Progress Notes (Signed)
[id number] 

## 2012-06-06 ENCOUNTER — Ambulatory Visit (INDEPENDENT_AMBULATORY_CARE_PROVIDER_SITE_OTHER): Payer: BC Managed Care – PPO | Admitting: Obstetrics & Gynecology

## 2012-06-06 VITALS — BP 135/81 | Wt 203.0 lb

## 2012-06-06 DIAGNOSIS — O24919 Unspecified diabetes mellitus in pregnancy, unspecified trimester: Secondary | ICD-10-CM

## 2012-06-06 DIAGNOSIS — O10019 Pre-existing essential hypertension complicating pregnancy, unspecified trimester: Secondary | ICD-10-CM

## 2012-06-06 DIAGNOSIS — Z3480 Encounter for supervision of other normal pregnancy, unspecified trimester: Secondary | ICD-10-CM

## 2012-06-06 DIAGNOSIS — O24913 Unspecified diabetes mellitus in pregnancy, third trimester: Secondary | ICD-10-CM

## 2012-06-06 DIAGNOSIS — O09523 Supervision of elderly multigravida, third trimester: Secondary | ICD-10-CM

## 2012-06-06 DIAGNOSIS — O09529 Supervision of elderly multigravida, unspecified trimester: Secondary | ICD-10-CM

## 2012-06-06 DIAGNOSIS — O10013 Pre-existing essential hypertension complicating pregnancy, third trimester: Secondary | ICD-10-CM

## 2012-06-06 MED ORDER — GLYBURIDE 2.5 MG PO TABS: ORAL_TABLET | ORAL | Status: DC

## 2012-06-06 NOTE — Patient Instructions (Signed)
Return to clinic for any obstetric concerns or go to [facility] for evaluation  

## 2012-06-06 NOTE — Progress Notes (Signed)
Fastings all within range except for one, many postprandials elevated. Increased Glyburide to 2.5 mg/5 mg.  Will reevaluate in one week.  BP stable.  Next growth scan scheduled 4/25.  Start 2x/week testing at 32 weeks. No other complaints or concerns.  Fetal movement and labor precautions reviewed.

## 2012-06-06 NOTE — Progress Notes (Signed)
P=[id number] 

## 2012-06-11 ENCOUNTER — Ambulatory Visit (INDEPENDENT_AMBULATORY_CARE_PROVIDER_SITE_OTHER): Payer: BC Managed Care – PPO | Admitting: Obstetrics & Gynecology

## 2012-06-11 VITALS — BP 132/93 | Wt 206.0 lb

## 2012-06-11 DIAGNOSIS — Z3480 Encounter for supervision of other normal pregnancy, unspecified trimester: Secondary | ICD-10-CM

## 2012-06-11 DIAGNOSIS — O2432 Unspecified pre-existing diabetes mellitus in childbirth: Secondary | ICD-10-CM

## 2012-06-11 DIAGNOSIS — O24913 Unspecified diabetes mellitus in pregnancy, third trimester: Secondary | ICD-10-CM

## 2012-06-11 DIAGNOSIS — O1002 Pre-existing essential hypertension complicating childbirth: Secondary | ICD-10-CM

## 2012-06-11 DIAGNOSIS — O09529 Supervision of elderly multigravida, unspecified trimester: Secondary | ICD-10-CM

## 2012-06-11 DIAGNOSIS — O09523 Supervision of elderly multigravida, third trimester: Secondary | ICD-10-CM

## 2012-06-11 DIAGNOSIS — O10013 Pre-existing essential hypertension complicating pregnancy, third trimester: Secondary | ICD-10-CM

## 2012-06-11 NOTE — Progress Notes (Signed)
Increased Glyburide to 5 mg po bid given elevated postprandials, reevaluate next week.  Patient desired to be seen by Dr. Shawnie Pons.  Start 2x/week testing at 32 weeks, scheduled for growth scan and BPP on 06/20/12. No other complaints or concerns.  Fetal movement and labor precautions reviewed.

## 2012-06-11 NOTE — Patient Instructions (Signed)
Return to clinic for any obstetric concerns or go to [facility] for evaluation  

## 2012-06-18 ENCOUNTER — Ambulatory Visit (INDEPENDENT_AMBULATORY_CARE_PROVIDER_SITE_OTHER): Payer: BC Managed Care – PPO | Admitting: Family Medicine

## 2012-06-18 VITALS — BP 126/70 | Wt 207.0 lb

## 2012-06-18 DIAGNOSIS — F5089 Other specified eating disorder: Secondary | ICD-10-CM | POA: Insufficient documentation

## 2012-06-18 DIAGNOSIS — Z3480 Encounter for supervision of other normal pregnancy, unspecified trimester: Secondary | ICD-10-CM

## 2012-06-18 NOTE — Patient Instructions (Addendum)
Gestational Diabetes Mellitus Gestational diabetes mellitus (GDM) is diabetes that occurs only during pregnancy. This happens when the body cannot properly handle the glucose (sugar) that increases in the blood after eating. During pregnancy, insulin resistance (reduced sensitivity to insulin) occurs because of the release of hormones from the placenta. Usually, the pancreas of pregnant women produces enough insulin to overcome the resistance that occurs. However, in gestational diabetes, the insulin is there but it does not work effectively. If the resistance is severe enough that the pancreas does not produce enough insulin, extra glucose builds up in the blood.  WHO IS AT RISK FOR DEVELOPING GESTATIONAL DIABETES?  Women with a history of diabetes in the family.  Women over age 25.  Women who are overweight.  Women in certain ethnic groups (Hispanic, African American, Native American, Asian and Pacific Islander). WHAT CAN HAPPEN TO THE BABY? If the mother's blood glucose is too high while she is pregnant, the extra sugar will travel through the umbilical cord to the baby. Some of the problems the baby may have are:  Large Baby - If the baby receives too much sugar, the baby will gain more weight. This may cause the baby to be too large to be born normally (vaginally) and a Cesarean section (C-section) may be needed.  Low Blood Glucose (hypoglycemia)  The baby makes extra insulin, in response to the extra sugar its gets from its mother. When the baby is born and no longer needs this extra insulin, the baby's blood glucose level may drop.  Jaundice (yellow coloring of the skin and eyes)  This is fairly common in babies. It is caused from a build-up of the chemical called bilirubin. This is rarely serious, but is seen more often in babies whose mothers had gestational diabetes. RISKS TO THE MOTHER Women who have had gestational diabetes may be at higher risk for some problems,  including:  Preeclampsia or toxemia, which includes problems with high blood pressure. Blood pressure and protein levels in the urine must be checked frequently.  Infections.  Cesarean section (C-section) for delivery.  Developing Type 2 diabetes later in life. About 30-50% will develop diabetes later, especially if obese. DIAGNOSIS  The hormones that cause insulin resistance are highest at about 24-28 weeks of pregnancy. If symptoms are experienced, they are much like symptoms you would normally expect during pregnancy.  GDM is often diagnosed using a two part method: 1. After 24-28 weeks of pregnancy, the woman drinks a glucose solution and takes a blood test. If the glucose level is high, a second test will be given. 2. Oral Glucose Tolerance Test (OGTT) which is 3 hours long  After not eating overnight, the blood glucose is checked. The woman drinks a glucose solution, and hourly blood glucose tests are taken. If the woman has risk factors for GDM, the caregiver may test earlier than 24 weeks of pregnancy. TREATMENT  Treatment of GDM is directed at keeping the mother's blood glucose level normal, and may include:  Meal planning.  Taking insulin or other medicine to control your blood glucose level.  Exercise.  Keeping a daily record of the foods you eat.  Blood glucose monitoring and keeping a record of your blood glucose levels.  May monitor ketone levels in the urine, although this is no longer considered necessary in most pregnancies. HOME CARE INSTRUCTIONS  While you are pregnant:  Follow your caregiver's advice regarding your prenatal appointments, meal planning, exercise, medicines, vitamins, blood and other tests, and physical   activities.  Keep a record of your meals, blood glucose tests, and the amount of insulin you are taking (if any). Show this to your caregiver at every prenatal visit.  If you have GDM, you may have problems with hypoglycemia (low blood glucose).  You may suspect this if you become suddenly dizzy, feel shaky, and/or weak. If you think this is happening and you have a glucose meter, try to test your blood glucose level. Follow your caregiver's advice for when and how to treat your low blood glucose. Generally, the 15:15 rule is followed: Treat by consuming 15 grams of carbohydrates, wait 15 minutes, and recheck blood glucose. Examples of 15 grams of carbohydrates are:  1 cup skim or low-fat milk.   cup juice.  3-4 glucose tablets.  5-6 hard candies.  1 small box raisins.   cup regular soda pop.  Practice good hygiene, to avoid infections.  Do not smoke. SEEK MEDICAL CARE IF:   You develop abnormal vaginal discharge, with or without itching.  You become weak and tired more than expected.  You seem to sweat a lot.  You have a sudden increase in weight, 5 pounds or more in one week.  You are losing weight, 3 pounds or more in a week.  Your blood glucose level is high, and you need instructions on what to do about it. SEEK IMMEDIATE MEDICAL CARE IF:   You develop a severe headache.  You faint or pass out.  You develop nausea and vomiting.  You become disoriented or confused.  You have a convulsion.  You develop vision problems.  You develop stomach pain.  You develop vaginal bleeding.  You develop uterine contractions.  You have leaking or a gush of fluid from the vagina. AFTER YOU HAVE THE BABY:  Go to all of your follow-up appointments, and have blood tests as advised by your caregiver.  Maintain a healthy lifestyle, to prevent diabetes in the future. This includes:  Following a healthy meal plan.  Controlling your weight.  Getting enough exercise and proper rest.  Do not smoke.  Breastfeed your baby if you can. This will lower the chance of you and your baby developing diabetes later in life. For more information about diabetes, go to the American Diabetes Association at:  PMFashions.com.cy. For more information about gestational diabetes, go to the [employer] at: RentRule.com.au. Document Released: 05/21/2000 Document Revised: 05/07/2011 Document Reviewed: 12/13/2008 [facility] Patient Information 2013 [place], [place].  Pregnancy - Third Trimester The third trimester of pregnancy (the last 3 months) is a period of the most rapid growth for you and your baby. The baby approaches a length of 20 inches and a weight of 6 to 10 pounds. The baby is adding on fat and getting ready for life outside your body. While inside, babies have periods of sleeping and waking, suck their thumbs, and hiccups. You can often feel small contractions of the uterus. This is false labor. It is also called Braxton-Hicks contractions. This is like a practice for labor. The usual problems in this stage of pregnancy include more difficulty breathing, swelling of the hands and feet from water retention, and having to urinate more often because of the uterus and baby pressing on your bladder.  PRENATAL EXAMS  Blood work may continue to be done during prenatal exams. These tests are done to check on your health and the probable health of your baby. Blood work is used to follow your blood levels (hemoglobin). Anemia (  low hemoglobin) is common during pregnancy. Iron and vitamins are given to help prevent this. You may also continue to be checked for diabetes. Some of the past blood tests may be done again.  The size of the uterus is measured during each visit. This makes sure your baby is growing properly according to your pregnancy dates.  Your blood pressure is checked every prenatal visit. This is to make sure you are not getting toxemia.  Your urine is checked every prenatal visit for infection, diabetes and protein.  Your weight is checked at each visit. This is done to make sure gains are happening at the suggested rate and that you and your  baby are growing normally.  Sometimes, an ultrasound is performed to confirm the position and the proper growth and development of the baby. This is a test done that bounces harmless sound waves off the baby so your caregiver can more accurately determine due dates.  Discuss the type of pain medication and anesthesia you will have during your labor and delivery.  Discuss the possibility and anesthesia if a Cesarean Section might be necessary.  Inform your caregiver if there is any mental or physical violence at home. Sometimes, a specialized non-stress test, contraction stress test and biophysical profile are done to make sure the baby is not having a problem. Checking the amniotic fluid surrounding the baby is called an amniocentesis. The amniotic fluid is removed by sticking a needle into the belly (abdomen). This is sometimes done near the end of pregnancy if an early delivery is required. In this case, it is done to help make sure the baby's lungs are mature enough for the baby to live outside of the womb. If the lungs are not mature and it is unsafe to deliver the baby, an injection of cortisone medication is given to the mother 1 to 2 days before the delivery. This helps the baby's lungs mature and makes it safer to deliver the baby. CHANGES OCCURING IN THE THIRD TRIMESTER OF PREGNANCY Your body goes through many changes during pregnancy. They vary from person to person. Talk to your caregiver about changes you notice and are concerned about.  During the last trimester, you have probably had an increase in your appetite. It is normal to have cravings for certain foods. This varies from person to person and pregnancy to pregnancy.  You may begin to get stretch marks on your hips, abdomen, and breasts. These are normal changes in the body during pregnancy. There are no exercises or medications to take which prevent this change.  Constipation may be treated with a stool softener or adding bulk to  your diet. Drinking lots of fluids, fiber in vegetables, fruits, and whole grains are helpful.  Exercising is also helpful. If you have been very active up until your pregnancy, most of these activities can be continued during your pregnancy. If you have been less active, it is helpful to start an exercise program such as walking. Consult your caregiver before starting exercise programs.  Avoid all smoking, alcohol, un-prescribed drugs, herbs and "street drugs" during your pregnancy. These chemicals affect the formation and growth of the baby. Avoid chemicals throughout the pregnancy to ensure the delivery of a healthy infant.  Backache, varicose veins and hemorrhoids may develop or get worse.  You will tire more easily in the third trimester, which is normal.  The baby's movements may be stronger and more often.  You may become short of breath easily.  Your belly  button may stick out.  A yellow discharge may leak from your breasts called colostrum.  You may have a bloody mucus discharge. This usually occurs a few days to a week before labor begins. HOME CARE INSTRUCTIONS   Keep your caregiver's appointments. Follow your caregiver's instructions regarding medication use, exercise, and diet.  During pregnancy, you are providing food for you and your baby. Continue to eat regular, well-balanced meals. Choose foods such as meat, fish, milk and other low fat dairy products, vegetables, fruits, and whole-grain breads and cereals. Your caregiver will tell you of the ideal weight gain.  A physical sexual relationship may be continued throughout pregnancy if there are no other problems such as early (premature) leaking of amniotic fluid from the membranes, vaginal bleeding, or belly (abdominal) pain.  Exercise regularly if there are no restrictions. Check with your caregiver if you are unsure of the safety of your exercises. Greater weight gain will occur in the last 2 trimesters of pregnancy.  Exercising helps:  Control your weight.  Get you in shape for labor and delivery.  You lose weight after you deliver.  Rest a lot with legs elevated, or as needed for leg cramps or low back pain.  Wear a good support or jogging bra for breast tenderness during pregnancy. This may help if worn during sleep. Pads or tissues may be used in the bra if you are leaking colostrum.  Do not use hot tubs, steam rooms, or saunas.  Wear your seat belt when driving. This protects you and your baby if you are in an accident.  Avoid raw meat, cat litter boxes and soil used by cats. These carry germs that can cause birth defects in the baby.  It is easier to loose urine during pregnancy. Tightening up and strengthening the pelvic muscles will help with this problem. You can practice stopping your urination while you are going to the bathroom. These are the same muscles you need to strengthen. It is also the muscles you would use if you were trying to stop from passing gas. You can practice tightening these muscles up 10 times a set and repeating this about 3 times per day. Once you know what muscles to tighten up, do not perform these exercises during urination. It is more likely to cause an infection by backing up the urine.  Ask for help if you have financial, counseling or nutritional needs during pregnancy. Your caregiver will be able to offer counseling for these needs as well as refer you for other special needs.  Make a list of emergency phone numbers and have them available.  Plan on getting help from family or friends when you go home from the hospital.  Make a trial run to the hospital.  Take prenatal classes with the father to understand, practice and ask questions about the labor and delivery.  Prepare the baby's room/nursery.  Do not travel out of the city unless it is absolutely necessary and with the advice of your caregiver.  Wear only low or no heal shoes to have better balance and  prevent falling. MEDICATIONS AND DRUG USE IN PREGNANCY  Take prenatal vitamins as directed. The vitamin should contain 1 milligram of folic acid. Keep all vitamins out of reach of children. Only a couple vitamins or tablets containing iron may be fatal to a baby or young child when ingested.  Avoid use of all medications, including herbs, over-the-counter medications, not prescribed or suggested by your caregiver. Only take over-the-counter or  prescription medicines for pain, discomfort, or fever as directed by your caregiver. Do not use aspirin, ibuprofen (Motrin, Advil, Nuprin) or naproxen (Aleve) unless OK'd by your caregiver.  Let your caregiver also know about herbs you may be using.  Alcohol is related to a number of birth defects. This includes fetal alcohol syndrome. All alcohol, in any form, should be avoided completely. Smoking will cause low birth rate and premature babies.  Street/illegal drugs are very harmful to the baby. They are absolutely forbidden. A baby born to an addicted mother will be addicted at birth. The baby will go through the same withdrawal an adult does. SEEK MEDICAL CARE IF: You have any concerns or worries during your pregnancy. It is better to call with your questions if you feel they cannot wait, rather than worry about them. DECISIONS ABOUT CIRCUMCISION You may or may not know the sex of your baby. If you know your baby is a boy, it may be time to think about circumcision. Circumcision is the removal of the foreskin of the penis. This is the skin that covers the sensitive end of the penis. There is no proven medical need for this. Often this decision is made on what is popular at the time or based upon religious beliefs and social issues. You can discuss these issues with your caregiver or pediatrician. SEEK IMMEDIATE MEDICAL CARE IF:   An unexplained oral temperature above 102 F (38.9 C) develops, or as your caregiver suggests.  You have leaking of fluid  from the vagina (birth canal). If leaking membranes are suspected, take your temperature and tell your caregiver of this when you call.  There is vaginal spotting, bleeding or passing clots. Tell your caregiver of the amount and how many pads are used.  You develop a bad smelling vaginal discharge with a change in the color from clear to white.  You develop vomiting that lasts more than 24 hours.  You develop chills or fever.  You develop shortness of breath.  You develop burning on urination.  You loose more than 2 pounds of weight or gain more than 2 pounds of weight or as suggested by your caregiver.  You notice sudden swelling of your face, hands, and feet or legs.  You develop belly (abdominal) pain. Round ligament discomfort is a common non-cancerous (benign) cause of abdominal pain in pregnancy. Your caregiver still must evaluate you.  You develop a severe headache that does not go away.  You develop visual problems, blurred or double vision.  If you have not felt your baby move for more than 1 hour. If you think the baby is not moving as much as usual, eat something with sugar in it and lie down on your left side for an hour. The baby should move at least 4 to 5 times per hour. Call right away if your baby moves less than that.  You fall, are in a car accident or any kind of trauma.  There is mental or physical violence at home. Document Released: 02/06/2001 Document Revised: 05/07/2011 Document Reviewed: 08/11/2008 [facility] Patient Information 2013 [place], [place].  Breastfeeding Deciding to breastfeed is one of the best choices you can make for you and your baby. The information that follows gives a brief overview of the benefits of breastfeeding as well as common topics surrounding breastfeeding. BENEFITS OF BREASTFEEDING For the baby  The first milk (colostrum) helps the baby's digestive system function better.   There are antibodies in the mother's milk that help  the baby fight off infections.   The baby has a lower incidence of asthma, allergies, and sudden infant death syndrome (SIDS).   The nutrients in breast milk are better for the baby than infant formulas, and breast milk helps the baby's brain grow better.   Babies who breastfeed have less gas, colic, and constipation.  For the mother  Breastfeeding helps develop a very special bond between the mother and her baby.   Breastfeeding is convenient, always available at the correct temperature, and costs nothing.   Breastfeeding burns calories in the mother and helps her lose weight that was gained during pregnancy.   Breastfeeding makes the uterus contract back down to normal size faster and slows bleeding following delivery.   Breastfeeding mothers have a lower risk of developing breast cancer.  BREASTFEEDING FREQUENCY  A healthy, full-term baby may breastfeed as often as every hour or space his or her feedings to every 3 hours.   Watch your baby for signs of hunger. Nurse your baby if he or she shows signs of hunger. How often you nurse will vary from baby to baby.   Nurse as often as the baby requests, or when you feel the need to reduce the fullness of your breasts.   Awaken the baby if it has been 3 4 hours since the last feeding.   Frequent feeding will help the mother make more milk and will help prevent problems, such as sore nipples and engorgement of the breasts.  BABY'S POSITION AT THE BREAST  Whether lying down or sitting, be sure that the baby's tummy is facing your tummy.   Support the breast with 4 fingers underneath the breast and the thumb above. Make sure your fingers are well away from the nipple and baby's mouth.   Stroke the baby's lips gently with your finger or nipple.   When the baby's mouth is open wide enough, place all of your nipple and as much of the areola as possible into your baby's mouth.   Pull the baby in close so the tip of the  nose and the baby's cheeks touch the breast during the feeding.  FEEDINGS AND SUCTION  The length of each feeding varies from baby to baby and from feeding to feeding.   The baby must suck about 2 3 minutes for your milk to get to him or her. This is called a "let down." For this reason, allow the baby to feed on each breast as long as he or she wants. Your baby will end the feeding when he or she has received the right balance of nutrients.   To break the suction, put your finger into the corner of the baby's mouth and slide it between his or her gums before removing your breast from his or her mouth. This will help prevent sore nipples.  HOW TO TELL WHETHER YOUR BABY IS GETTING ENOUGH BREAST MILK. Wondering whether or not your baby is getting enough milk is a common concern among mothers. You can be assured that your baby is getting enough milk if:   Your baby is actively sucking and you hear swallowing.   Your baby seems relaxed and satisfied after a feeding.   Your baby nurses at least 8 12 times in a 24 hour time period. Nurse your baby until he or she unlatches or falls asleep at the first breast (at least 10 20 minutes), then offer the second side.   Your baby is wetting 5 6 disposable diapers (  6 8 cloth diapers) in a 24 hour period by 15 40 days of age.   Your baby is having at least 3 4 stools every 24 hours for the first 6 weeks. The stool should be soft and yellow.   Your baby should gain 4 7 ounces per week after he or she is 30 days old.   Your breasts feel softer after nursing.  REDUCING BREAST ENGORGEMENT  In the first week after your baby is born, you may experience signs of breast engorgement. When breasts are engorged, they feel heavy, warm, full, and may be tender to the touch. You can reduce engorgement if you:   Nurse frequently, every 2 3 hours. Mothers who breastfeed early and often have fewer problems with engorgement.   Place light ice packs on your  breasts for 10 20 minutes between feedings. This reduces swelling. Wrap the ice packs in a lightweight towel to protect your skin. Bags of frozen vegetables work well for this purpose.   Take a warm shower or apply warm, moist heat to your breast for 5 10 minutes just before each feeding. This increases circulation and helps the milk flow.   Gently massage your breast before and during the feeding. Using your finger tips, massage from the chest wall towards your nipple in a circular motion.   Make sure that the baby empties at least one breast at every feeding before switching sides.   Use a breast pump to empty the breasts if your baby is sleepy or not nursing well. You may also want to pump if you are returning to work oryou feel you are getting engorged.   Avoid bottle feeds, pacifiers, or supplemental feedings of water or juice in place of breastfeeding. Breast milk is all the food your baby needs. It is not necessary for your baby to have water or formula. In fact, to help your breasts make more milk, it is best not to give your baby supplemental feedings during the early weeks.   Be sure the baby is latched on and positioned properly while breastfeeding.   Wear a supportive bra, avoiding underwire styles.   Eat a balanced diet with enough fluids.   Rest often, relax, and take your prenatal vitamins to prevent fatigue, stress, and anemia.  If you follow these suggestions, your engorgement should improve in 24 48 hours. If you are still experiencing difficulty, call your lactation consultant or caregiver.  CARING FOR YOURSELF Take care of your breasts  Bathe or shower daily.   Avoid using soap on your nipples.   Start feedings on your left breast at one feeding and on your right breast at the next feeding.   You will notice an increase in your milk supply 2 5 days after delivery. You may feel some discomfort from engorgement, which makes your breasts very firm and often  tender. Engorgement "peaks" out within 24 48 hours. In the meantime, apply warm moist towels to your breasts for 5 10 minutes before feeding. Gentle massage and expression of some milk before feeding will soften your breasts, making it easier for your baby to latch on.   Wear a well-fitting nursing bra, and air dry your nipples for a 3 after each feeding.   Only use cotton bra pads.   Only use pure lanolin on your nipples after nursing. You do not need to wash it off before feeding the baby again. Another option is to express a few drops of breast milk and gently  massage it into your nipples.  Take care of yourself  Eat well-balanced meals and nutritious snacks.   Drinking milk, fruit juice, and water to satisfy your thirst (about 8 glasses a day).   Get plenty of rest.  Avoid foods that you notice affect the baby in a bad way.  SEEK MEDICAL CARE IF:   You have difficulty with breastfeeding and need help.   You have a hard, red, sore area on your breast that is accompanied by a fever.   Your baby is too sleepy to eat well or is having trouble sleeping.   Your baby is wetting less than 6 diapers a day, by 16 days of age.   Your baby's skin or white part of his or her eyes is more yellow than it was in the hospital.   You feel depressed.  Document Released: 02/12/2005 Document Revised: 08/14/2011 Document Reviewed: 05/13/2011 [facility] Patient Information 2013 [place], [place].

## 2012-06-18 NOTE — Progress Notes (Signed)
Reports BS improved with Glyburide.  No BS today.  Reports none > 117.  She is for 2x/wk testing starting Friday with MFM.  Last hgb A1C was 6.2.  Reports PICA with flour and chalk.  PT. Is not anemic.  ONly has these cravings with pregnancy

## 2012-06-18 NOTE — Progress Notes (Signed)
[id number] 

## 2012-06-20 ENCOUNTER — Ambulatory Visit (HOSPITAL_COMMUNITY)
Admission: RE | Admit: 2012-06-20 | Discharge: 2012-06-20 | Disposition: A | Discharge status: Home / Self Care | Payer: BC Managed Care – PPO | Source: Ambulatory Visit | Attending: Family Medicine | Admitting: Family Medicine

## 2012-06-20 VITALS — BP 126/88 | HR 78 | Wt 206.0 lb

## 2012-06-20 DIAGNOSIS — O09529 Supervision of elderly multigravida, unspecified trimester: Secondary | ICD-10-CM | POA: Insufficient documentation

## 2012-06-20 DIAGNOSIS — O10013 Pre-existing essential hypertension complicating pregnancy, third trimester: Secondary | ICD-10-CM

## 2012-06-20 DIAGNOSIS — O99810 Abnormal glucose complicating pregnancy: Secondary | ICD-10-CM | POA: Insufficient documentation

## 2012-06-20 DIAGNOSIS — O10019 Pre-existing essential hypertension complicating pregnancy, unspecified trimester: Secondary | ICD-10-CM | POA: Insufficient documentation

## 2012-06-20 DIAGNOSIS — O24913 Unspecified diabetes mellitus in pregnancy, third trimester: Secondary | ICD-10-CM

## 2012-06-20 DIAGNOSIS — I10 Essential (primary) hypertension: Secondary | ICD-10-CM

## 2012-06-20 NOTE — Progress Notes (Signed)
Brittany Grant  was seen today for an ultrasound appointment.  See full report in [facility].  Impression: Single IUP at 32 0/7 weeks A2 GDM, chronic hypertension on Labetolol Interval fetal growth is appropriate (58th %tile) Normal amniotic fluid volume Active fetus with BPP of 8/8  Recommendations: Recommend 2x weekly antepartum fetal testing- plan weekly BPP with MFM Follow up growth in 4 weeks Recommend delivery at 39-40 weeks if not clinically indicated sooner  Alpha Gula, MD

## 2012-06-24 ENCOUNTER — Ambulatory Visit (INDEPENDENT_AMBULATORY_CARE_PROVIDER_SITE_OTHER): Payer: BC Managed Care – PPO | Admitting: Obstetrics & Gynecology

## 2012-06-24 ENCOUNTER — Encounter: Payer: Self-pay | Admitting: Obstetrics & Gynecology

## 2012-06-24 VITALS — BP 126/88 | Wt 206.0 lb

## 2012-06-24 DIAGNOSIS — O10019 Pre-existing essential hypertension complicating pregnancy, unspecified trimester: Secondary | ICD-10-CM

## 2012-06-24 DIAGNOSIS — O99334 Smoking (tobacco) complicating childbirth: Secondary | ICD-10-CM

## 2012-06-24 DIAGNOSIS — Z3480 Encounter for supervision of other normal pregnancy, unspecified trimester: Secondary | ICD-10-CM

## 2012-06-24 DIAGNOSIS — O10012 Pre-existing essential hypertension complicating pregnancy, second trimester: Secondary | ICD-10-CM

## 2012-06-24 DIAGNOSIS — Z3482 Encounter for supervision of other normal pregnancy, second trimester: Secondary | ICD-10-CM

## 2012-06-24 DIAGNOSIS — O99332 Smoking (tobacco) complicating pregnancy, second trimester: Secondary | ICD-10-CM

## 2012-06-24 NOTE — Progress Notes (Signed)
Routine visit/ NST. "Perfect" FM. No VB, ROM. She does have BH ctxs for a month. She is taking 5 mg glyburide at night and 2.5 mg in the morning. She did bring a few sugars with her today. "Not good" is her description of them. Her fastings are 90-110. Her 2 hour after meals 120-175. She has not seen a nurtritionist and refuses because she doesn't want to go back to [place] more often. She goes weekly for MFM visit/ BPP.

## 2012-06-27 ENCOUNTER — Ambulatory Visit (HOSPITAL_COMMUNITY)
Admission: RE | Admit: 2012-06-27 | Discharge: 2012-06-27 | Disposition: A | Discharge status: Home / Self Care | Payer: BC Managed Care – PPO | Source: Ambulatory Visit | Attending: Obstetrics and Gynecology | Admitting: Obstetrics and Gynecology

## 2012-06-27 VITALS — BP 121/88 | HR 83 | Wt 208.5 lb

## 2012-06-27 DIAGNOSIS — O24913 Unspecified diabetes mellitus in pregnancy, third trimester: Secondary | ICD-10-CM

## 2012-06-27 DIAGNOSIS — O99810 Abnormal glucose complicating pregnancy: Secondary | ICD-10-CM | POA: Insufficient documentation

## 2012-06-27 DIAGNOSIS — O10013 Pre-existing essential hypertension complicating pregnancy, third trimester: Secondary | ICD-10-CM

## 2012-06-27 DIAGNOSIS — O09529 Supervision of elderly multigravida, unspecified trimester: Secondary | ICD-10-CM | POA: Insufficient documentation

## 2012-06-27 DIAGNOSIS — O09523 Supervision of elderly multigravida, third trimester: Secondary | ICD-10-CM

## 2012-06-27 DIAGNOSIS — O10019 Pre-existing essential hypertension complicating pregnancy, unspecified trimester: Secondary | ICD-10-CM | POA: Insufficient documentation

## 2012-07-01 ENCOUNTER — Encounter: Payer: Self-pay | Admitting: Obstetrics and Gynecology

## 2012-07-01 ENCOUNTER — Ambulatory Visit (INDEPENDENT_AMBULATORY_CARE_PROVIDER_SITE_OTHER): Payer: BC Managed Care – PPO | Admitting: Obstetrics and Gynecology

## 2012-07-01 VITALS — BP 127/86 | Wt 208.0 lb

## 2012-07-01 DIAGNOSIS — I10 Essential (primary) hypertension: Secondary | ICD-10-CM

## 2012-07-01 DIAGNOSIS — O10013 Pre-existing essential hypertension complicating pregnancy, third trimester: Secondary | ICD-10-CM

## 2012-07-01 DIAGNOSIS — O24913 Unspecified diabetes mellitus in pregnancy, third trimester: Secondary | ICD-10-CM

## 2012-07-01 DIAGNOSIS — O878 Other venous complications in the puerperium: Secondary | ICD-10-CM

## 2012-07-01 DIAGNOSIS — O09529 Supervision of elderly multigravida, unspecified trimester: Secondary | ICD-10-CM

## 2012-07-01 DIAGNOSIS — O1002 Pre-existing essential hypertension complicating childbirth: Secondary | ICD-10-CM

## 2012-07-01 DIAGNOSIS — O2432 Unspecified pre-existing diabetes mellitus in childbirth: Secondary | ICD-10-CM

## 2012-07-01 DIAGNOSIS — O09523 Supervision of elderly multigravida, third trimester: Secondary | ICD-10-CM

## 2012-07-01 DIAGNOSIS — Z3480 Encounter for supervision of other normal pregnancy, unspecified trimester: Secondary | ICD-10-CM

## 2012-07-01 NOTE — Progress Notes (Signed)
NST reviewed and reactive. Patient doing well without complaints. Tired of being pregnant and having to watch her diet all the time. CBGs majority within range. 4/25 EFW 53%tile. F/U growth 5/23. Emotional support and encouragement provided. FM/PTL precautions reviewed

## 2012-07-01 NOTE — Progress Notes (Signed)
Having some cramping today.

## 2012-07-04 ENCOUNTER — Ambulatory Visit (HOSPITAL_COMMUNITY)
Admission: RE | Admit: 2012-07-04 | Discharge: 2012-07-04 | Disposition: A | Discharge status: Home / Self Care | Payer: BC Managed Care – PPO | Source: Ambulatory Visit | Attending: Obstetrics and Gynecology | Admitting: Obstetrics and Gynecology

## 2012-07-04 DIAGNOSIS — O10013 Pre-existing essential hypertension complicating pregnancy, third trimester: Secondary | ICD-10-CM

## 2012-07-04 DIAGNOSIS — O10019 Pre-existing essential hypertension complicating pregnancy, unspecified trimester: Secondary | ICD-10-CM | POA: Insufficient documentation

## 2012-07-04 DIAGNOSIS — O09529 Supervision of elderly multigravida, unspecified trimester: Secondary | ICD-10-CM | POA: Insufficient documentation

## 2012-07-04 DIAGNOSIS — O99810 Abnormal glucose complicating pregnancy: Secondary | ICD-10-CM | POA: Insufficient documentation

## 2012-07-04 DIAGNOSIS — O09523 Supervision of elderly multigravida, third trimester: Secondary | ICD-10-CM

## 2012-07-04 DIAGNOSIS — O24913 Unspecified diabetes mellitus in pregnancy, third trimester: Secondary | ICD-10-CM

## 2012-07-04 NOTE — Progress Notes (Signed)
Brittany Grant  was seen today for an ultrasound appointment.  See full report in [facility].  Impression: Single IUP at 34 0/7 weeks A2 GDM, chronic hypertension on Labetolol Normal amniotic fluid volume Active fetus with BPP of 8/8  Recommendations: Recommend 2x weekly antepartum fetal testing- plan weekly BPP with MFM Follow up growth in 2 weeks Recommend delivery at 39-40 weeks if not clinically indicated sooner  Alpha Gula, MD

## 2012-07-08 ENCOUNTER — Encounter: Payer: Self-pay | Admitting: Obstetrics and Gynecology

## 2012-07-08 ENCOUNTER — Ambulatory Visit (INDEPENDENT_AMBULATORY_CARE_PROVIDER_SITE_OTHER): Payer: BC Managed Care – PPO | Admitting: Obstetrics and Gynecology

## 2012-07-08 VITALS — BP 108/77 | Wt 209.0 lb

## 2012-07-08 DIAGNOSIS — O24919 Unspecified diabetes mellitus in pregnancy, unspecified trimester: Secondary | ICD-10-CM

## 2012-07-08 DIAGNOSIS — O09529 Supervision of elderly multigravida, unspecified trimester: Secondary | ICD-10-CM

## 2012-07-08 DIAGNOSIS — O24913 Unspecified diabetes mellitus in pregnancy, third trimester: Secondary | ICD-10-CM

## 2012-07-08 DIAGNOSIS — O09523 Supervision of elderly multigravida, third trimester: Secondary | ICD-10-CM

## 2012-07-08 DIAGNOSIS — O10013 Pre-existing essential hypertension complicating pregnancy, third trimester: Secondary | ICD-10-CM

## 2012-07-08 DIAGNOSIS — Z3480 Encounter for supervision of other normal pregnancy, unspecified trimester: Secondary | ICD-10-CM

## 2012-07-08 DIAGNOSIS — I10 Essential (primary) hypertension: Secondary | ICD-10-CM

## 2012-07-08 DIAGNOSIS — O228X9 Other venous complications in pregnancy, unspecified trimester: Secondary | ICD-10-CM

## 2012-07-08 DIAGNOSIS — O10019 Pre-existing essential hypertension complicating pregnancy, unspecified trimester: Secondary | ICD-10-CM

## 2012-07-08 NOTE — Progress Notes (Signed)
Patient doing well without complaints. FM/PTL precautions reviewed. CBG reviewed and most within range (2 values in 200 range and patient admits to drinking soda or eating a sweet desert). A few fasting values in the 50's, patient is not taking a bedtime snack which we discussed today. Continue twice weekly NST with weekly BPP with MFM. F/U growth ultrasound scheduled on 5/23 NST today reviewed and reactive

## 2012-07-11 ENCOUNTER — Encounter (HOSPITAL_COMMUNITY): Payer: Self-pay

## 2012-07-11 ENCOUNTER — Ambulatory Visit (HOSPITAL_COMMUNITY)
Admission: RE | Admit: 2012-07-11 | Discharge: 2012-07-11 | Disposition: A | Discharge status: Home / Self Care | Payer: BC Managed Care – PPO | Source: Ambulatory Visit | Attending: Obstetrics and Gynecology | Admitting: Obstetrics and Gynecology

## 2012-07-11 ENCOUNTER — Inpatient Hospital Stay (HOSPITAL_COMMUNITY)
Admission: AD | Admit: 2012-07-11 | Discharge: 2012-07-11 | Disposition: A | Discharge status: Home / Self Care | Payer: BC Managed Care – PPO | Source: Ambulatory Visit | Attending: Obstetrics & Gynecology | Admitting: Obstetrics & Gynecology

## 2012-07-11 VITALS — BP 145/104 | HR 83 | Wt 210.0 lb

## 2012-07-11 DIAGNOSIS — O1002 Pre-existing essential hypertension complicating childbirth: Secondary | ICD-10-CM

## 2012-07-11 DIAGNOSIS — O99810 Abnormal glucose complicating pregnancy: Secondary | ICD-10-CM | POA: Insufficient documentation

## 2012-07-11 DIAGNOSIS — O09523 Supervision of elderly multigravida, third trimester: Secondary | ICD-10-CM

## 2012-07-11 DIAGNOSIS — O10019 Pre-existing essential hypertension complicating pregnancy, unspecified trimester: Secondary | ICD-10-CM | POA: Insufficient documentation

## 2012-07-11 DIAGNOSIS — O10013 Pre-existing essential hypertension complicating pregnancy, third trimester: Secondary | ICD-10-CM

## 2012-07-11 DIAGNOSIS — O09529 Supervision of elderly multigravida, unspecified trimester: Secondary | ICD-10-CM | POA: Insufficient documentation

## 2012-07-11 DIAGNOSIS — O10913 Unspecified pre-existing hypertension complicating pregnancy, third trimester: Secondary | ICD-10-CM

## 2012-07-11 DIAGNOSIS — O24913 Unspecified diabetes mellitus in pregnancy, third trimester: Secondary | ICD-10-CM

## 2012-07-11 DIAGNOSIS — F5089 Other specified eating disorder: Secondary | ICD-10-CM

## 2012-07-11 LAB — URINALYSIS, ROUTINE W REFLEX MICROSCOPIC
Bilirubin Urine: NEGATIVE
Ketones, ur: NEGATIVE mg/dL
Nitrite: NEGATIVE
Protein, ur: NEGATIVE mg/dL
Urobilinogen, UA: 1 mg/dL (ref 0.0–1.0)

## 2012-07-11 LAB — COMPREHENSIVE METABOLIC PANEL
ALT: 6 U/L (ref 0–35)
AST: 13 U/L (ref 0–37)
Albumin: 2.8 g/dL — ABNORMAL LOW (ref 3.5–5.2)
Calcium: 8.8 mg/dL (ref 8.4–10.5)
GFR calc Af Amer: >90 mL/min (ref >90)
Glucose, Bld: 60 mg/dL — ABNORMAL LOW (ref 70–99)
Sodium: 133 mEq/L — ABNORMAL LOW (ref 135–145)
Total Protein: 6.3 g/dL (ref 6.0–8.3)

## 2012-07-11 LAB — CBC
MCH: 23 pg — ABNORMAL LOW (ref 26.0–34.0)
MCHC: 33.5 g/dL (ref 30.0–36.0)
Platelets: 162 10*3/uL (ref 150–400)
RDW: 15.5 % (ref 11.5–15.5)

## 2012-07-11 LAB — URINE MICROSCOPIC-ADD ON

## 2012-07-11 LAB — PROTEIN / CREATININE RATIO, URINE: Protein Creatinine Ratio: 0.13 (ref 0.00–0.15)

## 2012-07-11 MED ORDER — LABETALOL HCL 100 MG PO TABS: 200.0000 mg | ORAL_TABLET | Freq: Once | ORAL | Status: DC

## 2012-07-11 MED ORDER — ACETAMINOPHEN 500 MG PO TABS
1000.0000 mg | ORAL_TABLET | Freq: Once | ORAL | Status: AC
  Administered 2012-07-11: 1000 mg via ORAL
  Filled 2012-07-11: qty 2

## 2012-07-11 NOTE — Progress Notes (Signed)
Dr Macon Large aware of patient, bp reading and tracing. Cancel labetalol, order to d/c EFM.

## 2012-07-11 NOTE — ED Notes (Signed)
Patient instructed to take am dose of Labetalol since she had not done so. She was then walked to MAU for evaluation.

## 2012-07-11 NOTE — Progress Notes (Signed)
Brittany Grant  was seen today for an ultrasound appointment.  See full report in [facility].  Comments: Brittany Grant was seen today for BPP.  Blood pressures in clinic: 137/100, repeat 145/104.  The patient reports that she did not take her Latetalol this morning.  Impression: Single IUP at 35 0/7 weeks A2 GDM, chronic hypertension on Labetolol Normal amniotic fluid volume Active fetus with BPP of 8/8  Recommendations: Recommend follow-up ultrasound examination for growth and BPP next week. Patient was sent to MAU for further evaluation of her blood pressures.  Alpha Gula, MD

## 2012-07-11 NOTE — MAU Provider Note (Signed)
History     CSN: [id number]  Arrival date and time: 07/11/12 1146   None     Chief Complaint  Patient presents with  . Hypertension   HPI 42 y.o. G4P2002 at [redacted]w[redacted]d sent from MFM for BP eval. Pt has CHTN, takes labetalol, but did not take this morning prior to MFM appointment for BPP. BP elevated there. BPP 8/8. They sent her to MAU for further eval. Pt took Labetalol on her way to MAU. Denies headache, vision change, abdominal pain.   Past Medical History  Diagnosis Date  . Hypertension   . GERD (gastroesophageal reflux disease)   . IUD 04/26/2010  . Obesity   . Scarring, keloid   . Hemorrhoid     History reviewed. No pertinent past surgical history.  Family History  Problem Relation Age of Onset  . Hypertension Other   . Diabetes Mother   . Diabetes Father   . Hypertension Mother   . Hypertension Father   . Hypertension Father     History  Substance Use Topics  . Smoking status: Former Smoker -- 10 years    Types: Cigarettes    Quit date: 01/23/2012  . Smokeless tobacco: Never Used     Comment: smoking 1/4 pack per day  . Alcohol Use: No    Allergies: No Known Allergies  Prescriptions prior to admission  Medication Sig Dispense Refill  . glyBURIDE (DIABETA) 2.5 MG tablet 5 mg 3 (three) times daily with meals. Take 1 tablet in the morning and 1 tablet at bedtime      . labetalol (NORMODYNE) 200 MG tablet Take 1 tablet (200 mg total) by mouth 2 (two) times daily.  60 tablet  3  . omeprazole (PRILOSEC) 20 MG capsule Take 20 mg by mouth daily.        . Prenatal Vit-Fe Fumarate-FA (PRENATABS FA) TABS Take 1 tablet by mouth daily.  30 each  5    Review of Systems  Constitutional: Negative.   Eyes: Negative for blurred vision.  Respiratory: Negative.   Cardiovascular: Negative.   Gastrointestinal: Negative for nausea, vomiting, abdominal pain, diarrhea and constipation.  Genitourinary: Negative for dysuria, urgency, frequency, hematuria and flank pain.   Negative for vaginal bleeding, cramping/contractions  Musculoskeletal: Negative.   Neurological: Negative.  Negative for headaches.  Psychiatric/Behavioral: Negative.    Physical Exam   Blood pressure 109/81, pulse 81, height 5' (1.524 m), weight 210 lb (95.255 kg), last menstrual period 11/09/2011.  Patient Vitals for the past 24 hrs:  BP Pulse Height Weight  07/11/12 1230 109/81 mmHg 81 - -  07/11/12 1216 133/97 mmHg 88 - -  07/11/12 1205 - - 5' (1.524 m) 210 lb (95.255 kg)    Physical Exam  Nursing note and vitals reviewed. Constitutional: She is oriented to person, place, and time. She appears well-developed and well-nourished. No distress.  Cardiovascular: Normal rate.   Respiratory: Effort normal. No respiratory distress.  GI: Soft. There is no tenderness.  Neurological: She is alert and oriented to person, place, and time. She has normal reflexes.  Skin: Skin is warm and dry.  Psychiatric: She has a normal mood and affect.    MAU Course  Procedures  Results for orders placed during the hospital encounter of 07/11/12 (from the past 24 hour(s))  PROTEIN / CREATININE RATIO, URINE     Status: None   Collection Time    07/11/12 11:00 AM      Result Value Range   Creatinine, Urine 54.38  Total Protein, Urine 6.8     PROTEIN CREATININE RATIO 0.13  0.00 - 0.15  URINALYSIS, ROUTINE W REFLEX MICROSCOPIC     Status: Abnormal   Collection Time    07/11/12 12:11 PM      Result Value Range   Color, Urine YELLOW  YELLOW   APPearance CLEAR  CLEAR   Specific Gravity, Urine 1.010  1.005 - 1.030   pH 6.5  5.0 - 8.0   Glucose, UA NEGATIVE  NEGATIVE mg/dL   Hgb urine dipstick NEGATIVE  NEGATIVE   Bilirubin Urine NEGATIVE  NEGATIVE   Ketones, ur NEGATIVE  NEGATIVE mg/dL   Protein, ur NEGATIVE  NEGATIVE mg/dL   Urobilinogen, UA 1.0  0.0 - 1.0 mg/dL   Nitrite NEGATIVE  NEGATIVE   Leukocytes, UA TRACE (*) NEGATIVE  URINE MICROSCOPIC-ADD ON     Status: Abnormal   Collection  Time    07/11/12 12:11 PM      Result Value Range   Squamous Epithelial / LPF FEW (*) RARE   WBC, UA 0-2  <3 WBC/hpf  CBC     Status: Abnormal   Collection Time    07/11/12 12:27 PM      Result Value Range   WBC 7.1  4.0 - 10.5 K/uL   RBC 4.78  3.87 - 5.11 MIL/uL   Hemoglobin 11.0 (*) 12.0 - 15.0 g/dL   HCT 16.1 (*) 09.6 - 04.5 %   MCV 68.6 (*) 78.0 - 100.0 fL   MCH 23.0 (*) 26.0 - 34.0 pg   MCHC 33.5  30.0 - 36.0 g/dL   RDW 40.9  81.1 - 91.4 %   Platelets 162  150 - 400 K/uL  COMPREHENSIVE METABOLIC PANEL     Status: Abnormal   Collection Time    07/11/12 12:27 PM      Result Value Range   Sodium 133 (*) 135 - 145 mEq/L   Potassium 3.6  3.5 - 5.1 mEq/L   Chloride 106  96 - 112 mEq/L   CO2 19  19 - 32 mEq/L   Glucose, Bld 60 (*) 70 - 99 mg/dL   BUN 6  6 - 23 mg/dL   Creatinine, Ser 7.82  0.50 - 1.10 mg/dL   Calcium 8.8  8.4 - 95.6 mg/dL   Total Protein 6.3  6.0 - 8.3 g/dL   Albumin 2.8 (*) 3.5 - 5.2 g/dL   AST 13  0 - 37 U/L   ALT 6  0 - 35 U/L   Alkaline Phosphatase 126 (*) 39 - 117 U/L   Total Bilirubin 0.3  0.3 - 1.2 mg/dL   GFR calc non Af Amer >90  >90 mL/min   GFR calc Af Amer >90  >90 mL/min     Assessment and Plan   1. Chronic hypertension in pregnancy, third trimester   Elevated BP this morning after missing labetalol, BP normal after taking prescribed med and labs normal Precautions rev'd, follow up as scheduled, take meds as prescribed    Medication List    TAKE these medications       glyBURIDE 2.5 MG tablet  Commonly known as:  DIABETA  5 mg 3 (three) times daily with meals. Take 1 tablet in the morning and 1 tablet at bedtime     labetalol 200 MG tablet  Commonly known as:  NORMODYNE  Take 1 tablet (200 mg total) by mouth 2 (two) times daily.     omeprazole 20 MG capsule  Commonly  known as:  PRILOSEC  Take 20 mg by mouth daily.     Prenatabs FA Tabs  Take 1 tablet by mouth daily.        Follow-up Information   Follow up with Center  [facility]. (as scheduled)    Contact information:   [address] [place] [address] [phone]        [facility] 07/11/2012, 2:12 PM

## 2012-07-11 NOTE — MAU Note (Addendum)
Patient is sent from the maternal fetal medicine foe elevated BP. She states that she did not take her labetalol 200mg  prior to her appt. Patient states that she have taken the tablet now about 10 minutes ago. Patient denies any pain, headache, visual disturbance or epigastric pain.

## 2012-07-12 NOTE — MAU Provider Note (Signed)
Attestation of Attending Supervision of Advanced Practitioner (CNM/NP): Evaluation and management procedures were performed by the Advanced Practitioner under my supervision and collaboration.  I have reviewed the Advanced Practitioner's note and chart, and I agree with the management and plan.  HARRAWAY-SMITH, Hephzibah Strehle 5:41 PM     

## 2012-07-15 ENCOUNTER — Encounter (HOSPITAL_COMMUNITY): Payer: Self-pay

## 2012-07-15 ENCOUNTER — Ambulatory Visit (INDEPENDENT_AMBULATORY_CARE_PROVIDER_SITE_OTHER): Payer: BC Managed Care – PPO | Admitting: Obstetrics & Gynecology

## 2012-07-15 ENCOUNTER — Inpatient Hospital Stay (HOSPITAL_COMMUNITY)
Admission: AD | Admit: 2012-07-15 | Discharge: 2012-07-15 | Disposition: A | Discharge status: Home / Self Care | Payer: BC Managed Care – PPO | Source: Ambulatory Visit | Attending: Obstetrics and Gynecology | Admitting: Obstetrics and Gynecology

## 2012-07-15 VITALS — BP 134/92 | Wt 207.8 lb

## 2012-07-15 DIAGNOSIS — O09529 Supervision of elderly multigravida, unspecified trimester: Secondary | ICD-10-CM

## 2012-07-15 DIAGNOSIS — O99891 Other specified diseases and conditions complicating pregnancy: Secondary | ICD-10-CM | POA: Insufficient documentation

## 2012-07-15 DIAGNOSIS — O24911 Unspecified diabetes mellitus in pregnancy, first trimester: Secondary | ICD-10-CM

## 2012-07-15 DIAGNOSIS — K529 Noninfective gastroenteritis and colitis, unspecified: Secondary | ICD-10-CM

## 2012-07-15 DIAGNOSIS — Z3480 Encounter for supervision of other normal pregnancy, unspecified trimester: Secondary | ICD-10-CM

## 2012-07-15 DIAGNOSIS — O10013 Pre-existing essential hypertension complicating pregnancy, third trimester: Secondary | ICD-10-CM

## 2012-07-15 DIAGNOSIS — O1002 Pre-existing essential hypertension complicating childbirth: Secondary | ICD-10-CM

## 2012-07-15 DIAGNOSIS — O09523 Supervision of elderly multigravida, third trimester: Secondary | ICD-10-CM

## 2012-07-15 DIAGNOSIS — O212 Late vomiting of pregnancy: Secondary | ICD-10-CM | POA: Insufficient documentation

## 2012-07-15 DIAGNOSIS — K219 Gastro-esophageal reflux disease without esophagitis: Secondary | ICD-10-CM

## 2012-07-15 DIAGNOSIS — K5289 Other specified noninfective gastroenteritis and colitis: Secondary | ICD-10-CM

## 2012-07-15 DIAGNOSIS — O219 Vomiting of pregnancy, unspecified: Secondary | ICD-10-CM

## 2012-07-15 DIAGNOSIS — O2432 Unspecified pre-existing diabetes mellitus in childbirth: Secondary | ICD-10-CM

## 2012-07-15 LAB — URINALYSIS, ROUTINE W REFLEX MICROSCOPIC
Hgb urine dipstick: NEGATIVE
Protein, ur: NEGATIVE mg/dL
Urobilinogen, UA: 1 mg/dL (ref 0.0–1.0)

## 2012-07-15 MED ORDER — PANTOPRAZOLE SODIUM 40 MG PO TBEC: 40.0000 mg | DELAYED_RELEASE_TABLET | Freq: Every day | ORAL | Status: DC

## 2012-07-15 MED ORDER — ONDANSETRON 8 MG PO TBDP
8.0000 mg | ORAL_TABLET | Freq: Once | ORAL | Status: AC
  Administered 2012-07-15: 8 mg via ORAL
  Filled 2012-07-15: qty 1

## 2012-07-15 MED ORDER — PANTOPRAZOLE SODIUM 40 MG PO TBEC
40.0000 mg | DELAYED_RELEASE_TABLET | Freq: Once | ORAL | Status: AC
  Administered 2012-07-15: 40 mg via ORAL
  Filled 2012-07-15: qty 1

## 2012-07-15 MED ORDER — ONDANSETRON HCL 4 MG PO TABS: 4.0000 mg | ORAL_TABLET | Freq: Three times a day (TID) | ORAL | Status: DC | PRN

## 2012-07-15 NOTE — Progress Notes (Signed)
Normal CBGs. Slightly elevated DBP.  RUQ tenderness on exam.  Will sent to MAU for evaluation.  [facility] MD on call and MAU staff notified. Patient will get NST there and continue antenatal testing as recommended.

## 2012-07-15 NOTE — MAU Note (Signed)
Since Saturday has had n/v, gagging, spitting, denies diarrhea. Denies bleeding or vag d/c changes.

## 2012-07-15 NOTE — MAU Provider Note (Signed)
Chief Complaint:  Emesis   First Provider Initiated Contact with Patient 07/15/12 1107     HPI: Brittany Grant is a 42 y.o. G4P2002 at [redacted]w[redacted]d who was sent to maternity admissions from [facility] for persistent N/V since Saturday, Different than normal N/V of pregnancy. Reports vomiting ~3x/day. Also reports worsening GERD. Tolerating POs. Has not tried antiemetics of GERD meds.    Denies contractions, leakage of fluid or vaginal bleeding. Good fetal movement.   Patient Active Problem List   Diagnosis Date Noted  . Pica 06/18/2012  . Hemorrhoids complicating pregnancy or puerperium with antenatal complication 04/22/2012  . Diabetes mellitus, antepartum 03/05/2012  . Benign essential hypertension antepartum 02/12/2012  . AMA (advanced maternal age) multigravida 35+ 01/18/2012  . Tobacco smoking complicating pregnancy 12/20/2011  . Headache 11/21/2010  . Muscle twitching 11/21/2010  . HYPERTENSION 05/01/2010  . GERD 05/01/2010   Past Medical History: Past Medical History  Diagnosis Date  . Hypertension   . GERD (gastroesophageal reflux disease)   . IUD 04/26/2010  . Obesity   . Scarring, keloid   . Hemorrhoid     Past obstetric history: OB History   Grav Para Term Preterm Abortions TAB SAB Ect Mult Living   4 2 2  0 0 0 0 0 0 2     # Outc Date GA Lbr Len/2nd Wgt Sex Del Anes PTL Lv   1 TRM 8/96 [redacted]w[redacted]d  3.062kg(6lb12oz) M SVD      2 TRM 5/05 [redacted]w[redacted]d  3.175kg(7lb) M SVD      3 GRA            4 CUR               Past Surgical History: Past Surgical History  Procedure Laterality Date  . Keloid excision      Family History: Family History  Problem Relation Age of Onset  . Hypertension Other   . Diabetes Mother   . Diabetes Father   . Hypertension Mother   . Hypertension Father   . Hypertension Father     Social History: History  Substance Use Topics  . Smoking status: Former Smoker -- 10 years    Types: Cigarettes    Quit date: 01/23/2012  . Smokeless  tobacco: Never Used     Comment: smoking 1/4 pack per day  . Alcohol Use: No    Allergies: No Known Allergies  Meds:  Prescriptions prior to admission  Medication Sig Dispense Refill  . acetaminophen (TYLENOL) 500 MG tablet Take 1,000 mg by mouth every 6 (six) hours as needed for pain.      Marland Kitchen glyBURIDE (DIABETA) 2.5 MG tablet 5 mg 3 (three) times daily with meals. Take 1 tablet in the morning and 1 tablet at bedtime      . labetalol (NORMODYNE) 200 MG tablet Take 1 tablet (200 mg total) by mouth 2 (two) times daily.  60 tablet  3  . Prenatal Vit-Fe Fumarate-FA (PRENATABS FA) TABS Take 1 tablet by mouth daily.  30 each  5    ROS: Pertinent findings in history of present illness.  Physical Exam  Blood pressure 112/71, pulse 87, temperature 98.1 F (36.7 C), temperature source Oral, resp. rate 16, last menstrual period 11/09/2011, SpO2 100.00%. GENERAL: Well-developed, well-nourished female in no acute distress.  HEENT: normocephalic, mucus membranes moist. HEART: normal rate RESP: normal effort ABDOMEN: Soft, non-tender, gravid appropriate for gestational age EXTREMITIES: Nontender, no edema NEURO: alert and oriented SPECULUM EXAM: Deferred   FHT:  Baseline 130 , moderate variability, accelerations present, no decelerations Contractions: none   Labs: Results for orders placed during the hospital encounter of 07/15/12 (from the past 24 hour(s))  URINALYSIS, ROUTINE W REFLEX MICROSCOPIC     Status: None   Collection Time    07/15/12 10:15 AM      Result Value Range   Color, Urine YELLOW  YELLOW   APPearance CLEAR  CLEAR   Specific Gravity, Urine 1.010  1.005 - 1.030   pH 6.5  5.0 - 8.0   Glucose, UA NEGATIVE  NEGATIVE mg/dL   Hgb urine dipstick NEGATIVE  NEGATIVE   Bilirubin Urine NEGATIVE  NEGATIVE   Ketones, ur NEGATIVE  NEGATIVE mg/dL   Protein, ur NEGATIVE  NEGATIVE mg/dL   Urobilinogen, UA 1.0  0.0 - 1.0 mg/dL   Nitrite NEGATIVE  NEGATIVE   Leukocytes, UA NEGATIVE   NEGATIVE    Imaging:  NA  MAU Course: No evidence of dehydration. Pt tolerating POs on arrival. No vomiting while in MAU. Nausea and GERD resolved w/ Zofran and Protonix.   Assessment: 1. Gastroenteritis, acute   2. Gastroesophageal reflux in pregancy in third trimester    Plan: Discharge home Preterm l abor precautions and fetal kick counts. Advance diet slowly.    Follow-up Information   Follow up with [facility] On 07/22/2012. (as scheduled)    Contact information:   [address] [place] [address] [phone]      Follow up with [facility] [place] [facility]. (As needed if symptoms worsen)    Contact information:   [address] [id number] [place] [address] [phone]       Medication List    TAKE these medications       acetaminophen 500 MG tablet  Commonly known as:  TYLENOL  Take 1,000 mg by mouth every 6 (six) hours as needed for pain.     glyBURIDE 2.5 MG tablet  Commonly known as:  DIABETA  5 mg 3 (three) times daily with meals. Take 1 tablet in the morning and 1 tablet at bedtime     labetalol 200 MG tablet  Commonly known as:  NORMODYNE  Take 1 tablet (200 mg total) by mouth 2 (two) times daily.     ondansetron 4 MG tablet  Commonly known as:  ZOFRAN  Take 1 tablet (4 mg total) by mouth every 8 (eight) hours as needed for nausea.     pantoprazole 40 MG tablet  Commonly known as:  PROTONIX  Take 1 tablet (40 mg total) by mouth daily.     Prenatabs FA Tabs  Take 1 tablet by mouth daily.       [place], CNM 07/15/2012 11:20 AM

## 2012-07-15 NOTE — MAU Note (Signed)
Patient is sent from her doctor's office with c/o new onset of n/v. She denies any abdominal pain, headache, visual disturbance or epigastric pain. She denies vaginal bleeding or lof. Reports good fetal movement

## 2012-07-15 NOTE — Patient Instructions (Signed)
Return to clinic for any obstetric concerns or go to [facility] for evaluation  

## 2012-07-15 NOTE — Progress Notes (Signed)
[place] [facility] cnm notified that patient is feeling better is ready for discharge. Order to discharge patient home.

## 2012-07-15 NOTE — Progress Notes (Signed)
Patient has been vomiting since Saturday and is not feeling well.  Worse this morning than it has been ever.

## 2012-07-17 NOTE — MAU Provider Note (Signed)
Attestation of Attending Supervision of Advanced Practitioner (CNM/NP): Evaluation and management procedures were performed by the Advanced Practitioner under my supervision and collaboration.  I have reviewed the Advanced Practitioner's note and chart, and I agree with the management and plan.  Chao Blazejewski 07/17/2012 12:57 PM

## 2012-07-18 ENCOUNTER — Ambulatory Visit (HOSPITAL_COMMUNITY)
Admission: RE | Admit: 2012-07-18 | Discharge: 2012-07-18 | Disposition: A | Discharge status: Home / Self Care | Payer: BC Managed Care – PPO | Source: Ambulatory Visit | Attending: Obstetrics and Gynecology | Admitting: Obstetrics and Gynecology

## 2012-07-18 DIAGNOSIS — O99810 Abnormal glucose complicating pregnancy: Secondary | ICD-10-CM | POA: Insufficient documentation

## 2012-07-18 DIAGNOSIS — O10013 Pre-existing essential hypertension complicating pregnancy, third trimester: Secondary | ICD-10-CM

## 2012-07-18 DIAGNOSIS — O10019 Pre-existing essential hypertension complicating pregnancy, unspecified trimester: Secondary | ICD-10-CM | POA: Insufficient documentation

## 2012-07-18 DIAGNOSIS — O09523 Supervision of elderly multigravida, third trimester: Secondary | ICD-10-CM

## 2012-07-18 DIAGNOSIS — O24913 Unspecified diabetes mellitus in pregnancy, third trimester: Secondary | ICD-10-CM

## 2012-07-18 DIAGNOSIS — O09529 Supervision of elderly multigravida, unspecified trimester: Secondary | ICD-10-CM | POA: Insufficient documentation

## 2012-07-18 NOTE — Progress Notes (Signed)
[facility]  Indication: 42 yr old G3P2002 at  [redacted]w[redacted]d with chronic hypertension and type II diabetes for follow up ultrasound.  Findings: 1. Single intrauterine pregnancy. 2. Estimated fetal weight is in the 70th%. 3. Anterior placenta without evidence of previa. 4. Normal amniotic fluid index. 5. The limited anatomy survey is normal. 6. Normal biophysical profile of 8/8.  Recommendations: 1. Appropriate fetal growth. 2. Diabetes: - previously counseled - on glyburide - recommend strict glucose control - continue antenatal testing - recommend delivery by estimated due date  3. Hypertension: - previously counseled - on labetalol - recommend continue antenatal testing - recommend close surveillance for the development of signs/symptoms of preeclampsia - recommend delivery by estimated due date but not prior to 38 weeks in the absence of other complications 4. Advanced maternal age: - previously counseled - had normal cell free fetal DNA screen  Eulis Foster, MD

## 2012-07-22 ENCOUNTER — Ambulatory Visit (INDEPENDENT_AMBULATORY_CARE_PROVIDER_SITE_OTHER): Payer: BC Managed Care – PPO | Admitting: Family Medicine

## 2012-07-22 ENCOUNTER — Encounter: Payer: Self-pay | Admitting: Family Medicine

## 2012-07-22 VITALS — BP 114/80 | Wt 211.0 lb

## 2012-07-22 DIAGNOSIS — O24919 Unspecified diabetes mellitus in pregnancy, unspecified trimester: Secondary | ICD-10-CM

## 2012-07-22 DIAGNOSIS — O24913 Unspecified diabetes mellitus in pregnancy, third trimester: Secondary | ICD-10-CM

## 2012-07-22 DIAGNOSIS — O10013 Pre-existing essential hypertension complicating pregnancy, third trimester: Secondary | ICD-10-CM

## 2012-07-22 DIAGNOSIS — O10019 Pre-existing essential hypertension complicating pregnancy, unspecified trimester: Secondary | ICD-10-CM

## 2012-07-22 DIAGNOSIS — Z3480 Encounter for supervision of other normal pregnancy, unspecified trimester: Secondary | ICD-10-CM

## 2012-07-22 NOTE — Progress Notes (Signed)
No BS but reports nml BS BP ok. U/S for growth shows 6lb 5oz, transverse--feels vertex today Needs cultures today NST reviewed and reactive.

## 2012-07-22 NOTE — Patient Instructions (Signed)
Gestational Diabetes Mellitus Gestational diabetes mellitus, often simply referred to as gestational diabetes, is a type of diabetes that some women develop during pregnancy. In gestational diabetes, the pancreas does not make enough insulin (a hormone), the cells are less responsive to the insulin that is made (insulin resistance), or both.Normally, insulin moves sugars from food into the tissue cells. The tissue cells use the sugars for energy. The lack of insulin or the lack of normal response to insulin causes excess sugars to build up in the blood instead of going into the tissue cells. As a result, high blood sugar (hyperglycemia) develops. The effect of high sugar (glucose) levels can cause many complications.  RISK FACTORS You have an increased chance of developing gestational diabetes if you have a family history of diabetes and also have one or more of the following risk factors:  A body mass index over 30 (obesity).  A previous pregnancy with gestational diabetes.  An older age at the time of pregnancy. If blood glucose levels are kept in the normal range during pregnancy, women can have a healthy pregnancy. If your blood glucose levels are not well controlled, there may be risks to you, your unborn baby (fetus), your labor and delivery, or your newborn baby.  SYMPTOMS  If symptoms are experienced, they are much like symptoms you would normally expect during pregnancy. The symptoms of gestational diabetes include:   Increased thirst (polydipsia).  Increased urination (polyuria).  Increased urination during the night (nocturia).  Weight loss. This weight loss may be rapid.  Frequent, recurring infections.  Tiredness (fatigue).  Weakness.  Vision changes, such as blurred vision.  Fruity smell to your breath.  Abdominal pain. DIAGNOSIS Diabetes is diagnosed when blood glucose levels are increased. Your blood glucose level may be checked by one or more of the following  blood tests:  A fasting blood glucose test. You will not be allowed to eat for at least 8 hours before a blood sample is taken.  A random blood glucose test. Your blood glucose is checked at any time of the day regardless of when you ate.  A hemoglobin A1c blood glucose test. A hemoglobin A1c test provides information about blood glucose control over the previous 3 months.  An oral glucose tolerance test (OGTT). Your blood glucose is measured after you have not eaten (fasted) for 1 3 hours and then after you drink a glucose-containing beverage. Since the hormones that cause insulin resistance are highest at about 24 28 weeks of a pregnancy, an OGTT is usually performed during that time. If you have risk factors for gestational diabetes, your caregiver may test you for gestational diabetes earlier than 24 weeks of pregnancy. TREATMENT   You will need to take diabetes medicine or insulin daily to keep blood glucose levels in the desired range.  You will need to match insulin dosing with exercise and healthy food choices. The treatment goal is to maintain the before meal (preprandial), bedtime, and overnight blood glucose level at 60 99 mg/dL during pregnancy. The treatment goal is to further maintain peak after meal blood sugar (postprandial glucose) level at 100 140 mg/dL.  HOME CARE INSTRUCTIONS   Have your hemoglobin A1c level checked twice a year.  Perform daily blood glucose monitoring as directed by your caregiver. It is common to perform frequent blood glucose monitoring.  Monitor urine ketones when you are ill and as directed by your caregiver.  Take your diabetes medicine and insulin as directed by your caregiver to   maintain your blood glucose level in the desired range.  Never run out of diabetes medicine or insulin. It is needed every day.  Adjust insulin based on your intake of carbohydrates. Carbohydrates can raise blood glucose levels but need to be included in your diet.  Carbohydrates provide vitamins, minerals, and fiber which are an essential part of a healthy diet. Carbohydrates are found in fruits, vegetables, whole grains, dairy products, legumes, and foods containing added sugars.    Eat healthy foods. Alternate 3 meals with 3 snacks.  Maintain a healthy weight gain. The usual total expected weight gain varies according to your prepregnancy body mass index (BMI).  Carry a medical alert card or wear your medical alert jewelry.  Carry a 15 gram carbohydrate snack with you at all times to treat low blood glucose (hypoglycemia). Some examples of 15 gram carbohydrate snacks include:  Glucose tablets, 3 or 4   Glucose gel, 15 gram tube  Raisins, 2 tablespoons (24 g)  Jelly beans, 6  Animal crackers, 8  Fruit juice, regular soda, or low fat milk, 4 ounces (120 mL)  Gummy treats, 9    Recognize hypoglycemia. Hypoglycemia during pregnancy occurs with blood glucose levels of 60 mg/dL and below. The risk for hypoglycemia increases when fasting or skipping meals, during or after intense exercise, and during sleep. Hypoglycemia symptoms can include:  Tremors or shakes.  Decreased ability to concentrate.  Sweating.  Increased heart rate.  Headache.  Dry mouth.  Hunger.  Irritability.  Anxiety.  Restless sleep.  Altered speech or coordination.  Confusion.  Treat hypoglycemia promptly. If you are alert and able to safely swallow, follow the 15:15 rule:  Take 15 20 grams of rapid-acting glucose or carbohydrate. Rapid-acting options include glucose gel, glucose tablets, or 4 ounces (120 mL) of fruit juice, regular soda, or low fat milk.  Check your blood glucose level 15 minutes after taking the glucose.   Take 15 20 grams more of glucose if the repeat blood glucose level is still 70 mg/dL or below.  Eat a meal or snack within 1 hour once blood glucose levels return to normal.  Be alert to polyuria and polydipsia which are early  signs of hyperglycemia. An early awareness of hyperglycemia allows for prompt treatment. Treat hyperglycemia as directed by your caregiver.  Engage in at least 30 minutes of physical activity a day or as directed by your caregiver. Ten minutes of physical activity timed 30 minutes after each meal is encouraged to control postprandial blood glucose levels.  Adjust your insulin dosing and food intake as needed if you start a new exercise or sport.  Follow your sick day plan at any time you are unable to eat or drink as usual.  Avoid tobacco and alcohol use.  Follow up with your caregiver regularly.  Follow the advice of your caregiver regarding your prenatal and post-delivery (postpartum) appointments, meal planning, exercise, medicines, vitamins, blood tests, other medical tests, and physical activities.  Perform daily skin and foot care. Examine your skin and feet daily for cuts, bruises, redness, nail problems, bleeding, blisters, or sores.  Brush your teeth and gums at least twice a day and floss at least once a day. Follow up with your dentist regularly.  Schedule an eye exam during the first trimester of your pregnancy or as directed by your caregiver.  Share your diabetes management plan with your workplace or school.  Stay up-to-date with immunizations.  Learn to manage stress.  Obtain ongoing diabetes education   and support as needed. SEEK MEDICAL CARE IF:   You are unable to eat food or drink fluids for more than 6 hours.  You have nausea and vomiting for more than 6 hours.  You have a blood glucose level of 200 mg/dL and you have ketones in your urine.  There is a change in mental status.  You develop vision problems.  You have a persistent headache.  You have upper abdominal pain or discomfort.  You develop an additional serious illness.  You have diarrhea for more than 6 hours.  You have been sick or have had a fever for a couple of days and are not getting  better. SEEK IMMEDIATE MEDICAL CARE IF:   You have difficulty breathing.  You no longer feel the baby moving.  You are bleeding or have discharge from your vagina.  You start having premature contractions or labor. MAKE SURE YOU:  Understand these instructions.  Will watch your condition.  Will get help right away if you are not doing well or get worse. Document Released: 05/21/2000 Document Revised: 11/07/2011 Document Reviewed: 09/11/2011 [employer] Patient Information 2014 [employer].  Breastfeeding A change in hormones during your pregnancy causes growth of your breast tissue and an increase in number and size of milk ducts. The hormone prolactin allows proteins, sugars, and fats from your blood supply to make breast milk in your milk-producing glands. The hormone progesterone prevents breast milk from being released before the birth of your baby. After the birth of your baby, your progesterone level decreases allowing breast milk to be released. Thoughts of your baby, as well as his or her sucking or crying, can stimulate the release of milk from the milk-producing glands. Deciding to breastfeed (nurse) is one of the best choices you can make for you and your baby. The information that follows gives a brief review of the benefits, as well as other important skills to know about breastfeeding. BENEFITS OF BREASTFEEDING For your baby  The first milk (colostrum) helps your baby's digestive system function better.   There are antibodies in your milk that help your baby fight off infections.   Your baby has a lower incidence of asthma, allergies, and sudden infant death syndrome (SIDS).   The nutrients in breast milk are better for your baby than infant formulas.  Breast milk improves your baby's brain development.   Your baby will have less gas, colic, and constipation.  Your baby is less likely to develop other conditions, such as childhood obesity, asthma, or diabetes  mellitus. For you  Breastfeeding helps develop a very special bond between you and your baby.   Breastfeeding is convenient, always available at the correct temperature, and costs nothing.   Breastfeeding helps to burn calories and helps you lose the weight gained during pregnancy.   Breastfeeding makes your uterus contract back down to normal size faster and slows bleeding following delivery.   Breastfeeding mothers have a lower risk of developing osteoporosis or breast or ovarian cancer later in life.  BREASTFEEDING FREQUENCY  A healthy, full-term baby may breastfeed as often as every hour or space his or her feedings to every 3 hours. Breastfeeding frequency will vary from baby to baby.   Newborns should be fed no less than every 2 3 hours during the day and every 4 5 hours during the night. You should breastfeed a minimum of 8 feedings in a 24 hour period.  Awaken your baby to breastfeed if it has been 3 4   hours since the last feeding.  Breastfeed when you feel the need to reduce the fullness of your breasts or when your newborn shows signs of hunger. Signs that your baby may be hungry include:  Increased alertness or activity.  Stretching.  Movement of the head from side to side.  Movement of the head and opening of the mouth when the corner of the mouth or cheek is stroked (rooting).  Increased sucking sounds, smacking lips, cooing, sighing, or squeaking.  Hand-to-mouth movements.  Increased sucking of fingers or hands.  Fussing.  Intermittent crying.  Signs of extreme hunger will require calming and consoling before you try to feed your baby. Signs of extreme hunger may include:  Restlessness.  A loud, strong cry.  Screaming.  Frequent feeding will help you make more milk and will help prevent problems, such as sore nipples and engorgement of the breasts.  BREASTFEEDING   Whether lying down or sitting, be sure that the baby's abdomen is facing your  abdomen.   Support your breast with 4 fingers under your breast and your thumb above your nipple. Make sure your fingers are well away from your nipple and your baby's mouth.   Stroke your baby's lips gently with your finger or nipple.   When your baby's mouth is open wide enough, place all of your nipple and as much of the colored area around your nipple (areola) as possible into your baby's mouth.  More areola should be visible above his or her upper lip than below his or her lower lip.  Your baby's tongue should be between his or her lower gum and your breast.  Ensure that your baby's mouth is correctly positioned around the nipple (latched). Your baby's lips should create a seal on your breast.  Signs that your baby has effectively latched onto your nipple include:  Tugging or sucking without pain.  Swallowing heard between sucks.  Absent click or smacking sound.  Muscle movement above and in front of his or her ears with sucking.  Your baby must suck about 2 3 minutes in order to get your milk. Allow your baby to feed on each breast as long as he or she wants. Nurse your baby until he or she unlatches or falls asleep at the first breast, then offer the second breast.  Signs that your baby is full and satisfied include:  A gradual decrease in the number of sucks or complete cessation of sucking.  Falling asleep.  Extension or relaxation of his or her body.  Retention of a small amount of milk in his or her mouth.  Letting go of your breast by himself or herself.  Signs of effective breastfeeding in you include:  Breasts that have increased firmness, weight, and size prior to feeding.  Breasts that are softer after nursing.  Increased milk volume, as well as a change in milk consistency and color by the 5th day of breastfeeding.  Breast fullness relieved by breastfeeding.  Nipples are not sore, cracked, or bleeding.  If needed, break the suction by putting your  finger into the corner of your baby's mouth and sliding your finger between his or her gums. Then, remove your breast from his or her mouth.  It is common for babies to spit up a small amount after a feeding.  Babies often swallow air during feeding. This can make babies fussy. Burping your baby between breasts can help with this.  Vitamin D supplements are recommended for babies who get only   breast milk.  Avoid using a pacifier during your baby's first 4 6 weeks.  Avoid supplemental feedings of water, formula, or juice in place of breastfeeding. Breast milk is all the food your baby needs. It is not necessary for your baby to have water or formula. Your breasts will make more milk if supplemental feedings are avoided during the early weeks. HOW TO TELL WHETHER YOUR BABY IS GETTING ENOUGH BREAST MILK Wondering whether or not your baby is getting enough milk is a common concern among mothers. You can be assured that your baby is getting enough milk if:   Your baby is actively sucking and you hear swallowing.   Your baby seems relaxed and satisfied after a feeding.   Your baby nurses at least 8 12 times in a 24 hour time period.  During the first 3 5 days of age:  Your baby is wetting at least 3 5 diapers in a 24 hour period. The urine should be clear and pale yellow.  Your baby is having at least 3 4 stools in a 24 hour period. The stool should be soft and yellow.  At 5 7 days of age, your baby is having at least 3 6 stools in a 24 hour period. The stool should be seedy and yellow by 5 days of age.  Your baby has a weight loss less than 7 10% during the first 3 days of age.  Your baby does not lose weight after 3 7 days of age.  Your baby gains 4 7 ounces each week after he or she is 4 days of age.  Your baby gains weight by 5 days of age and is back to birth weight within 2 weeks. ENGORGEMENT In the first week after your baby is born, you may experience extremely full breasts  (engorgement). When engorged, your breasts may feel heavy, warm, or tender to the touch. Engorgement peaks within 24 48 hours after delivery of your baby.  Engorgement may be reduced by:  Continuing to breastfeed.  Increasing the frequency of breastfeeding.  Taking warm showers or applying warm, moist heat to your breasts just before each feeding. This increases circulation and helps the milk flow.   Gently massaging your breast before and during the feedings. With your fingertips, massage from your chest wall towards your nipple in a circular motion.   Ensuring that your baby empties at least one breast at every feeding. It also helps to start the next feeding on the opposite breast.   Expressing breast milk by hand or by using a breast pump to empty the breasts if your baby is sleepy, or not nursing well. You may also want to express milk if you are returning to work oryou feel you are getting engorged.  Ensuring your baby is latched on and positioned properly while breastfeeding. If you follow these suggestions, your engorgement should improve in 24 48 hours. If you are still experiencing difficulty, call your lactation consultant or caregiver.  CARING FOR YOURSELF Take care of your breasts.  Bathe or shower daily.   Avoid using soap on your nipples.   Wear a supportive bra. Avoid wearing underwire style bras.  Air dry your nipples for a 3 4minutes after each feeding.   Use only cotton bra pads to absorb breast milk leakage. Leaking of breast milk between feedings is normal.   Use only pure lanolin on your nipples after nursing. You do not need to wash it off before feeding your baby again.   Another option is to express a few drops of breast milk and gently massage that milk into your nipples.  Continue breast self-awareness checks. Take care of yourself.  Eat healthy foods. Alternate 3 meals with 3 snacks.  Avoid foods that you notice affect your baby in a bad  way.  Drink milk, fruit juice, and water to satisfy your thirst (about 8 glasses a day).   Rest often, relax, and take your prenatal vitamins to prevent fatigue, stress, and anemia.  Avoid chewing and smoking tobacco.  Avoid alcohol and drug use.  Take over-the-counter and prescribed medicine only as directed by your caregiver or pharmacist. You should always check with your caregiver or pharmacist before taking any new medicine, vitamin, or herbal supplement.  Know that pregnancy is possible while breastfeeding. If desired, talk to your caregiver about family planning and safe birth control methods that may be used while breastfeeding. SEEK MEDICAL CARE IF:   You feel like you want to stop breastfeeding or have become frustrated with breastfeeding.  You have painful breasts or nipples.  Your nipples are cracked or bleeding.  Your breasts are red, tender, or warm.  You have a swollen area on either breast.  You have a fever or chills.  You have nausea or vomiting.  You have drainage from your nipples.  Your breasts do not become full before feedings by the 5th day after delivery.  You feel sad and depressed.  Your baby is too sleepy to eat well.  Your baby is having trouble sleeping.   Your baby is wetting less than 3 diapers in a 24 hour period.  Your baby has less than 3 stools in a 24 hour period.  Your baby's skin or the white part of his or her eyes becomes more yellow.   Your baby is not gaining weight by 5 days of age. MAKE SURE YOU:   Understand these instructions.  Will watch your condition.  Will get help right away if you are not doing well or get worse. Document Released: 02/12/2005 Document Revised: 11/07/2011 Document Reviewed: 09/19/2011 [employer] Patient Information 2014 [employer].  

## 2012-07-23 LAB — GC/CHLAMYDIA PROBE AMP: CT Probe RNA: NEGATIVE

## 2012-07-25 ENCOUNTER — Other Ambulatory Visit: Payer: Self-pay | Admitting: Family Medicine

## 2012-07-25 ENCOUNTER — Ambulatory Visit (HOSPITAL_COMMUNITY)
Admission: RE | Admit: 2012-07-25 | Discharge: 2012-07-25 | Disposition: A | Discharge status: Home / Self Care | Payer: BC Managed Care – PPO | Source: Ambulatory Visit | Attending: Family Medicine | Admitting: Family Medicine

## 2012-07-25 VITALS — BP 107/77 | HR 90 | Wt 210.2 lb

## 2012-07-25 DIAGNOSIS — O10013 Pre-existing essential hypertension complicating pregnancy, third trimester: Secondary | ICD-10-CM

## 2012-07-25 DIAGNOSIS — O09529 Supervision of elderly multigravida, unspecified trimester: Secondary | ICD-10-CM | POA: Insufficient documentation

## 2012-07-25 DIAGNOSIS — O99810 Abnormal glucose complicating pregnancy: Secondary | ICD-10-CM

## 2012-07-25 DIAGNOSIS — O10019 Pre-existing essential hypertension complicating pregnancy, unspecified trimester: Secondary | ICD-10-CM | POA: Insufficient documentation

## 2012-07-25 DIAGNOSIS — O09523 Supervision of elderly multigravida, third trimester: Secondary | ICD-10-CM

## 2012-07-25 DIAGNOSIS — O24913 Unspecified diabetes mellitus in pregnancy, third trimester: Secondary | ICD-10-CM

## 2012-07-25 LAB — OB RESULTS CONSOLE GBS: GBS: NEGATIVE

## 2012-07-29 ENCOUNTER — Ambulatory Visit (INDEPENDENT_AMBULATORY_CARE_PROVIDER_SITE_OTHER): Payer: BC Managed Care – PPO | Admitting: Family Medicine

## 2012-07-29 VITALS — BP 110/81 | Wt 209.0 lb

## 2012-07-29 DIAGNOSIS — O24919 Unspecified diabetes mellitus in pregnancy, unspecified trimester: Secondary | ICD-10-CM

## 2012-07-29 DIAGNOSIS — Z3480 Encounter for supervision of other normal pregnancy, unspecified trimester: Secondary | ICD-10-CM

## 2012-07-29 DIAGNOSIS — O10013 Pre-existing essential hypertension complicating pregnancy, third trimester: Secondary | ICD-10-CM

## 2012-07-29 DIAGNOSIS — O24913 Unspecified diabetes mellitus in pregnancy, third trimester: Secondary | ICD-10-CM

## 2012-07-29 DIAGNOSIS — O10019 Pre-existing essential hypertension complicating pregnancy, unspecified trimester: Secondary | ICD-10-CM

## 2012-07-29 NOTE — Progress Notes (Signed)
FBS 54-96 (2 out of range) 2 hour pp 82-201 (6 of 15 out of range) Schedule IOL NST reviewed and reactive.

## 2012-07-29 NOTE — Assessment & Plan Note (Signed)
BP looks good  

## 2012-07-29 NOTE — Patient Instructions (Signed)
Gestational Diabetes Mellitus Gestational diabetes mellitus, often simply referred to as gestational diabetes, is a type of diabetes that some women develop during pregnancy. In gestational diabetes, the pancreas does not make enough insulin (a hormone), the cells are less responsive to the insulin that is made (insulin resistance), or both.Normally, insulin moves sugars from food into the tissue cells. The tissue cells use the sugars for energy. The lack of insulin or the lack of normal response to insulin causes excess sugars to build up in the blood instead of going into the tissue cells. As a result, high blood sugar (hyperglycemia) develops. The effect of high sugar (glucose) levels can cause many complications.  RISK FACTORS You have an increased chance of developing gestational diabetes if you have a family history of diabetes and also have one or more of the following risk factors:  A body mass index over 30 (obesity).  A previous pregnancy with gestational diabetes.  An older age at the time of pregnancy. If blood glucose levels are kept in the normal range during pregnancy, women can have a healthy pregnancy. If your blood glucose levels are not well controlled, there may be risks to you, your unborn baby (fetus), your labor and delivery, or your newborn baby.  SYMPTOMS  If symptoms are experienced, they are much like symptoms you would normally expect during pregnancy. The symptoms of gestational diabetes include:   Increased thirst (polydipsia).  Increased urination (polyuria).  Increased urination during the night (nocturia).  Weight loss. This weight loss may be rapid.  Frequent, recurring infections.  Tiredness (fatigue).  Weakness.  Vision changes, such as blurred vision.  Fruity smell to your breath.  Abdominal pain. DIAGNOSIS Diabetes is diagnosed when blood glucose levels are increased. Your blood glucose level may be checked by one or more of the following  blood tests:  A fasting blood glucose test. You will not be allowed to eat for at least 8 hours before a blood sample is taken.  A random blood glucose test. Your blood glucose is checked at any time of the day regardless of when you ate.  A hemoglobin A1c blood glucose test. A hemoglobin A1c test provides information about blood glucose control over the previous 3 months.  An oral glucose tolerance test (OGTT). Your blood glucose is measured after you have not eaten (fasted) for 1 3 hours and then after you drink a glucose-containing beverage. Since the hormones that cause insulin resistance are highest at about 24 28 weeks of a pregnancy, an OGTT is usually performed during that time. If you have risk factors for gestational diabetes, your caregiver may test you for gestational diabetes earlier than 24 weeks of pregnancy. TREATMENT   You will need to take diabetes medicine or insulin daily to keep blood glucose levels in the desired range.  You will need to match insulin dosing with exercise and healthy food choices. The treatment goal is to maintain the before meal (preprandial), bedtime, and overnight blood glucose level at 60 99 mg/dL during pregnancy. The treatment goal is to further maintain peak after meal blood sugar (postprandial glucose) level at 100 140 mg/dL.  HOME CARE INSTRUCTIONS   Have your hemoglobin A1c level checked twice a year.  Perform daily blood glucose monitoring as directed by your caregiver. It is common to perform frequent blood glucose monitoring.  Monitor urine ketones when you are ill and as directed by your caregiver.  Take your diabetes medicine and insulin as directed by your caregiver to   maintain your blood glucose level in the desired range.  Never run out of diabetes medicine or insulin. It is needed every day.  Adjust insulin based on your intake of carbohydrates. Carbohydrates can raise blood glucose levels but need to be included in your diet.  Carbohydrates provide vitamins, minerals, and fiber which are an essential part of a healthy diet. Carbohydrates are found in fruits, vegetables, whole grains, dairy products, legumes, and foods containing added sugars.    Eat healthy foods. Alternate 3 meals with 3 snacks.  Maintain a healthy weight gain. The usual total expected weight gain varies according to your prepregnancy body mass index (BMI).  Carry a medical alert card or wear your medical alert jewelry.  Carry a 15 gram carbohydrate snack with you at all times to treat low blood glucose (hypoglycemia). Some examples of 15 gram carbohydrate snacks include:  Glucose tablets, 3 or 4   Glucose gel, 15 gram tube  Raisins, 2 tablespoons (24 g)  Jelly beans, 6  Animal crackers, 8  Fruit juice, regular soda, or low fat milk, 4 ounces (120 mL)  Gummy treats, 9    Recognize hypoglycemia. Hypoglycemia during pregnancy occurs with blood glucose levels of 60 mg/dL and below. The risk for hypoglycemia increases when fasting or skipping meals, during or after intense exercise, and during sleep. Hypoglycemia symptoms can include:  Tremors or shakes.  Decreased ability to concentrate.  Sweating.  Increased heart rate.  Headache.  Dry mouth.  Hunger.  Irritability.  Anxiety.  Restless sleep.  Altered speech or coordination.  Confusion.  Treat hypoglycemia promptly. If you are alert and able to safely swallow, follow the 15:15 rule:  Take 15 20 grams of rapid-acting glucose or carbohydrate. Rapid-acting options include glucose gel, glucose tablets, or 4 ounces (120 mL) of fruit juice, regular soda, or low fat milk.  Check your blood glucose level 15 minutes after taking the glucose.   Take 15 20 grams more of glucose if the repeat blood glucose level is still 70 mg/dL or below.  Eat a meal or snack within 1 hour once blood glucose levels return to normal.  Be alert to polyuria and polydipsia which are early  signs of hyperglycemia. An early awareness of hyperglycemia allows for prompt treatment. Treat hyperglycemia as directed by your caregiver.  Engage in at least 30 minutes of physical activity a day or as directed by your caregiver. Ten minutes of physical activity timed 30 minutes after each meal is encouraged to control postprandial blood glucose levels.  Adjust your insulin dosing and food intake as needed if you start a new exercise or sport.  Follow your sick day plan at any time you are unable to eat or drink as usual.  Avoid tobacco and alcohol use.  Follow up with your caregiver regularly.  Follow the advice of your caregiver regarding your prenatal and post-delivery (postpartum) appointments, meal planning, exercise, medicines, vitamins, blood tests, other medical tests, and physical activities.  Perform daily skin and foot care. Examine your skin and feet daily for cuts, bruises, redness, nail problems, bleeding, blisters, or sores.  Brush your teeth and gums at least twice a day and floss at least once a day. Follow up with your dentist regularly.  Schedule an eye exam during the first trimester of your pregnancy or as directed by your caregiver.  Share your diabetes management plan with your workplace or school.  Stay up-to-date with immunizations.  Learn to manage stress.  Obtain ongoing diabetes education   and support as needed. SEEK MEDICAL CARE IF:   You are unable to eat food or drink fluids for more than 6 hours.  You have nausea and vomiting for more than 6 hours.  You have a blood glucose level of 200 mg/dL and you have ketones in your urine.  There is a change in mental status.  You develop vision problems.  You have a persistent headache.  You have upper abdominal pain or discomfort.  You develop an additional serious illness.  You have diarrhea for more than 6 hours.  You have been sick or have had a fever for a couple of days and are not getting  better. SEEK IMMEDIATE MEDICAL CARE IF:   You have difficulty breathing.  You no longer feel the baby moving.  You are bleeding or have discharge from your vagina.  You start having premature contractions or labor. MAKE SURE YOU:  Understand these instructions.  Will watch your condition.  Will get help right away if you are not doing well or get worse. Document Released: 05/21/2000 Document Revised: 11/07/2011 Document Reviewed: 09/11/2011 [employer] Patient Information 2014 [employer].  Breastfeeding A change in hormones during your pregnancy causes growth of your breast tissue and an increase in number and size of milk ducts. The hormone prolactin allows proteins, sugars, and fats from your blood supply to make breast milk in your milk-producing glands. The hormone progesterone prevents breast milk from being released before the birth of your baby. After the birth of your baby, your progesterone level decreases allowing breast milk to be released. Thoughts of your baby, as well as his or her sucking or crying, can stimulate the release of milk from the milk-producing glands. Deciding to breastfeed (nurse) is one of the best choices you can make for you and your baby. The information that follows gives a brief review of the benefits, as well as other important skills to know about breastfeeding. BENEFITS OF BREASTFEEDING For your baby  The first milk (colostrum) helps your baby's digestive system function better.   There are antibodies in your milk that help your baby fight off infections.   Your baby has a lower incidence of asthma, allergies, and sudden infant death syndrome (SIDS).   The nutrients in breast milk are better for your baby than infant formulas.  Breast milk improves your baby's brain development.   Your baby will have less gas, colic, and constipation.  Your baby is less likely to develop other conditions, such as childhood obesity, asthma, or diabetes  mellitus. For you  Breastfeeding helps develop a very special bond between you and your baby.   Breastfeeding is convenient, always available at the correct temperature, and costs nothing.   Breastfeeding helps to burn calories and helps you lose the weight gained during pregnancy.   Breastfeeding makes your uterus contract back down to normal size faster and slows bleeding following delivery.   Breastfeeding mothers have a lower risk of developing osteoporosis or breast or ovarian cancer later in life.  BREASTFEEDING FREQUENCY  A healthy, full-term baby may breastfeed as often as every hour or space his or her feedings to every 3 hours. Breastfeeding frequency will vary from baby to baby.   Newborns should be fed no less than every 2 3 hours during the day and every 4 5 hours during the night. You should breastfeed a minimum of 8 feedings in a 24 hour period.  Awaken your baby to breastfeed if it has been 3 4   hours since the last feeding.  Breastfeed when you feel the need to reduce the fullness of your breasts or when your newborn shows signs of hunger. Signs that your baby may be hungry include:  Increased alertness or activity.  Stretching.  Movement of the head from side to side.  Movement of the head and opening of the mouth when the corner of the mouth or cheek is stroked (rooting).  Increased sucking sounds, smacking lips, cooing, sighing, or squeaking.  Hand-to-mouth movements.  Increased sucking of fingers or hands.  Fussing.  Intermittent crying.  Signs of extreme hunger will require calming and consoling before you try to feed your baby. Signs of extreme hunger may include:  Restlessness.  A loud, strong cry.  Screaming.  Frequent feeding will help you make more milk and will help prevent problems, such as sore nipples and engorgement of the breasts.  BREASTFEEDING   Whether lying down or sitting, be sure that the baby's abdomen is facing your  abdomen.   Support your breast with 4 fingers under your breast and your thumb above your nipple. Make sure your fingers are well away from your nipple and your baby's mouth.   Stroke your baby's lips gently with your finger or nipple.   When your baby's mouth is open wide enough, place all of your nipple and as much of the colored area around your nipple (areola) as possible into your baby's mouth.  More areola should be visible above his or her upper lip than below his or her lower lip.  Your baby's tongue should be between his or her lower gum and your breast.  Ensure that your baby's mouth is correctly positioned around the nipple (latched). Your baby's lips should create a seal on your breast.  Signs that your baby has effectively latched onto your nipple include:  Tugging or sucking without pain.  Swallowing heard between sucks.  Absent click or smacking sound.  Muscle movement above and in front of his or her ears with sucking.  Your baby must suck about 2 3 minutes in order to get your milk. Allow your baby to feed on each breast as long as he or she wants. Nurse your baby until he or she unlatches or falls asleep at the first breast, then offer the second breast.  Signs that your baby is full and satisfied include:  A gradual decrease in the number of sucks or complete cessation of sucking.  Falling asleep.  Extension or relaxation of his or her body.  Retention of a small amount of milk in his or her mouth.  Letting go of your breast by himself or herself.  Signs of effective breastfeeding in you include:  Breasts that have increased firmness, weight, and size prior to feeding.  Breasts that are softer after nursing.  Increased milk volume, as well as a change in milk consistency and color by the 5th day of breastfeeding.  Breast fullness relieved by breastfeeding.  Nipples are not sore, cracked, or bleeding.  If needed, break the suction by putting your  finger into the corner of your baby's mouth and sliding your finger between his or her gums. Then, remove your breast from his or her mouth.  It is common for babies to spit up a small amount after a feeding.  Babies often swallow air during feeding. This can make babies fussy. Burping your baby between breasts can help with this.  Vitamin D supplements are recommended for babies who get only   breast milk.  Avoid using a pacifier during your baby's first 4 6 weeks.  Avoid supplemental feedings of water, formula, or juice in place of breastfeeding. Breast milk is all the food your baby needs. It is not necessary for your baby to have water or formula. Your breasts will make more milk if supplemental feedings are avoided during the early weeks. HOW TO TELL WHETHER YOUR BABY IS GETTING ENOUGH BREAST MILK Wondering whether or not your baby is getting enough milk is a common concern among mothers. You can be assured that your baby is getting enough milk if:   Your baby is actively sucking and you hear swallowing.   Your baby seems relaxed and satisfied after a feeding.   Your baby nurses at least 8 12 times in a 24 hour time period.  During the first 3 5 days of age:  Your baby is wetting at least 3 5 diapers in a 24 hour period. The urine should be clear and pale yellow.  Your baby is having at least 3 4 stools in a 24 hour period. The stool should be soft and yellow.  At 5 7 days of age, your baby is having at least 3 6 stools in a 24 hour period. The stool should be seedy and yellow by 5 days of age.  Your baby has a weight loss less than 7 10% during the first 3 days of age.  Your baby does not lose weight after 3 7 days of age.  Your baby gains 4 7 ounces each week after he or she is 4 days of age.  Your baby gains weight by 5 days of age and is back to birth weight within 2 weeks. ENGORGEMENT In the first week after your baby is born, you may experience extremely full breasts  (engorgement). When engorged, your breasts may feel heavy, warm, or tender to the touch. Engorgement peaks within 24 48 hours after delivery of your baby.  Engorgement may be reduced by:  Continuing to breastfeed.  Increasing the frequency of breastfeeding.  Taking warm showers or applying warm, moist heat to your breasts just before each feeding. This increases circulation and helps the milk flow.   Gently massaging your breast before and during the feedings. With your fingertips, massage from your chest wall towards your nipple in a circular motion.   Ensuring that your baby empties at least one breast at every feeding. It also helps to start the next feeding on the opposite breast.   Expressing breast milk by hand or by using a breast pump to empty the breasts if your baby is sleepy, or not nursing well. You may also want to express milk if you are returning to work oryou feel you are getting engorged.  Ensuring your baby is latched on and positioned properly while breastfeeding. If you follow these suggestions, your engorgement should improve in 24 48 hours. If you are still experiencing difficulty, call your lactation consultant or caregiver.  CARING FOR YOURSELF Take care of your breasts.  Bathe or shower daily.   Avoid using soap on your nipples.   Wear a supportive bra. Avoid wearing underwire style bras.  Air dry your nipples for a 3 4minutes after each feeding.   Use only cotton bra pads to absorb breast milk leakage. Leaking of breast milk between feedings is normal.   Use only pure lanolin on your nipples after nursing. You do not need to wash it off before feeding your baby again.   Another option is to express a few drops of breast milk and gently massage that milk into your nipples.  Continue breast self-awareness checks. Take care of yourself.  Eat healthy foods. Alternate 3 meals with 3 snacks.  Avoid foods that you notice affect your baby in a bad  way.  Drink milk, fruit juice, and water to satisfy your thirst (about 8 glasses a day).   Rest often, relax, and take your prenatal vitamins to prevent fatigue, stress, and anemia.  Avoid chewing and smoking tobacco.  Avoid alcohol and drug use.  Take over-the-counter and prescribed medicine only as directed by your caregiver or pharmacist. You should always check with your caregiver or pharmacist before taking any new medicine, vitamin, or herbal supplement.  Know that pregnancy is possible while breastfeeding. If desired, talk to your caregiver about family planning and safe birth control methods that may be used while breastfeeding. SEEK MEDICAL CARE IF:   You feel like you want to stop breastfeeding or have become frustrated with breastfeeding.  You have painful breasts or nipples.  Your nipples are cracked or bleeding.  Your breasts are red, tender, or warm.  You have a swollen area on either breast.  You have a fever or chills.  You have nausea or vomiting.  You have drainage from your nipples.  Your breasts do not become full before feedings by the 5th day after delivery.  You feel sad and depressed.  Your baby is too sleepy to eat well.  Your baby is having trouble sleeping.   Your baby is wetting less than 3 diapers in a 24 hour period.  Your baby has less than 3 stools in a 24 hour period.  Your baby's skin or the white part of his or her eyes becomes more yellow.   Your baby is not gaining weight by 5 days of age. MAKE SURE YOU:   Understand these instructions.  Will watch your condition.  Will get help right away if you are not doing well or get worse. Document Released: 02/12/2005 Document Revised: 11/07/2011 Document Reviewed: 09/19/2011 [employer] Patient Information 2014 [employer].  

## 2012-07-29 NOTE — Assessment & Plan Note (Signed)
Dietary indiscretions discussed.

## 2012-07-30 ENCOUNTER — Telehealth (HOSPITAL_COMMUNITY): Payer: Self-pay | Admitting: *Deleted

## 2012-07-30 ENCOUNTER — Encounter (HOSPITAL_COMMUNITY): Payer: Self-pay | Admitting: Family

## 2012-07-30 ENCOUNTER — Inpatient Hospital Stay (HOSPITAL_COMMUNITY)
Admission: AD | Admit: 2012-07-30 | Discharge: 2012-07-30 | Disposition: A | Discharge status: Home / Self Care | Payer: BC Managed Care – PPO | Source: Ambulatory Visit | Attending: Obstetrics and Gynecology | Admitting: Obstetrics and Gynecology

## 2012-07-30 ENCOUNTER — Inpatient Hospital Stay (HOSPITAL_COMMUNITY): Payer: BC Managed Care – PPO

## 2012-07-30 DIAGNOSIS — O09523 Supervision of elderly multigravida, third trimester: Secondary | ICD-10-CM

## 2012-07-30 DIAGNOSIS — O24913 Unspecified diabetes mellitus in pregnancy, third trimester: Secondary | ICD-10-CM

## 2012-07-30 DIAGNOSIS — O368190 Decreased fetal movements, unspecified trimester, not applicable or unspecified: Secondary | ICD-10-CM | POA: Insufficient documentation

## 2012-07-30 DIAGNOSIS — F5089 Other specified eating disorder: Secondary | ICD-10-CM

## 2012-07-30 DIAGNOSIS — O479 False labor, unspecified: Secondary | ICD-10-CM | POA: Insufficient documentation

## 2012-07-30 DIAGNOSIS — O10013 Pre-existing essential hypertension complicating pregnancy, third trimester: Secondary | ICD-10-CM

## 2012-07-30 DIAGNOSIS — O368130 Decreased fetal movements, third trimester, not applicable or unspecified: Secondary | ICD-10-CM

## 2012-07-30 NOTE — MAU Note (Signed)
Audible movement- pt confirms- "she is moving now"

## 2012-07-30 NOTE — Telephone Encounter (Signed)
Preadmission screen  

## 2012-07-30 NOTE — MAU Note (Signed)
Was here yesterday, prolonged monitoring. Took a long time to get reactive tracing.  Contractions- mild- not regular.

## 2012-07-30 NOTE — MAU Provider Note (Signed)
History     CSN: [id number]  Arrival date and time: 07/30/12 1055   None     Chief Complaint  Patient presents with  . Decreased Fetal Movement   HPI 42 y.o. [id number] at [redacted]w[redacted]d here with decreased fetal movement x 2 days. Pt had NST yesterday that was only reactive at the end. She has felt very little movement before or after that. She has been doing kick counts and was up most of the night waiting for movement. Feels occasional "flutter."  This morning she drank a pepsi and still didn't feel discreet movements. Is feeling a little more now that she is on the monitor but not much.  Receives prenatal care in high risk clinic at [facility]. Has Class A2/B GDM on glyburide. States BS are well controlled (fasting 70-90, PP 100-120s). Chronic hypertension, on labetalol. BP have been fairly well controlled. No headache, vision changes or RUQ pain today. No bleeding or LOF. Braxton-Hicks ctx.    OB History   Grav Para Term Preterm Abortions TAB SAB Ect Mult Living   4 2 2  0 0 0 0 0 0 2      Patient Active Problem List   Diagnosis Date Noted  . Pica 06/18/2012  . Hemorrhoids complicating pregnancy or puerperium with antenatal complication 04/22/2012  . Diabetes mellitus, antepartum 03/05/2012  . Benign essential hypertension antepartum 02/12/2012  . AMA (advanced maternal age) multigravida 35+ 01/18/2012  . Tobacco smoking complicating pregnancy 12/20/2011  . Headache 11/21/2010  . Muscle twitching 11/21/2010  . HYPERTENSION 05/01/2010  . GERD 05/01/2010     Past Medical History  Diagnosis Date  . Hypertension   . GERD (gastroesophageal reflux disease)   . IUD 04/26/2010  . Obesity   . Scarring, keloid   . Hemorrhoid     Past Surgical History  Procedure Laterality Date  . Keloid excision      Family History  Problem Relation Age of Onset  . Hypertension Other   . Diabetes Mother   . Diabetes Father   . Hypertension Mother   . Hypertension Father   . Hypertension Father      History  Substance Use Topics  . Smoking status: Former Smoker -- 10 years    Types: Cigarettes    Quit date: 01/23/2012  . Smokeless tobacco: Never Used     Comment: smoking 1/4 pack per day  . Alcohol Use: No    Allergies: No Known Allergies  Prescriptions prior to admission  Medication Sig Dispense Refill  . acetaminophen (TYLENOL) 500 MG tablet Take 1,000 mg by mouth every 6 (six) hours as needed for pain.      Marland Kitchen glyBURIDE (DIABETA) 2.5 MG tablet 5 mg 3 (three) times daily with meals. Take 1 tablet in the morning and 2 tablet at bedtime      . labetalol (NORMODYNE) 200 MG tablet Take 1 tablet (200 mg total) by mouth 2 (two) times daily.  60 tablet  3  . ondansetron (ZOFRAN) 4 MG tablet Take 1 tablet (4 mg total) by mouth every 8 (eight) hours as needed for nausea.  30 tablet  1  . pantoprazole (PROTONIX) 40 MG tablet Take 1 tablet (40 mg total) by mouth daily.  30 tablet  1  . Prenatal Vit-Fe Fumarate-FA (PRENATABS FA) TABS Take 1 tablet by mouth daily.  30 each  5    ROS  See HPI.  Physical Exam   Blood pressure 118/74, pulse 87, temperature 98.2 F (36.8 C), temperature  source Oral, resp. rate 18, last menstrual period 11/09/2011.  Physical Exam GEN:  WNWD, no distress HEENT:  NCAT, EOMI, conjunctiva clear NECK:  Supple, non-tender, no thyromegaly, trachea midline CV: RRR, no murmur RESP:  CTAB ABD:  Soft, non-tender, no guarding or rebound, normal bowel sounds EXTREM:  Warm, well perfused, no edema or tenderness NEURO:  Alert, oriented, no focal deficits GU:  Deferred (GC/Chlamydia, wet prep, pelvic exam done in MAU recently)  BPP: 8/8   MAU Course  Procedures    Assessment and Plan  42 y.o. [id number] at [redacted]w[redacted]d with decreased fetal movement. 10/10 BPP Reassurance. Reviewed kick counts.  Chronic HTN - BP controlled today F/U in clinic as scheduled  Napoleon Form 07/30/2012, 12:18 PM

## 2012-07-30 NOTE — MAU Note (Signed)
Patient presents to MAU with c/o decreased fetal movement yesterday and today; reports she was seen at [facility] yesterday and told to come in today for BPP.

## 2012-08-01 ENCOUNTER — Ambulatory Visit (HOSPITAL_COMMUNITY): Payer: BC Managed Care – PPO | Attending: Obstetrics and Gynecology

## 2012-08-01 NOTE — MAU Provider Note (Signed)
Attestation of Attending Supervision of Advanced Practitioner (CNM/NP): Evaluation and management procedures were performed by the Advanced Practitioner under my supervision and collaboration.  I have reviewed the Advanced Practitioner's note and chart, and I agree with the management and plan.  Erian Rosengren 08/01/2012 7:06 AM

## 2012-08-02 ENCOUNTER — Inpatient Hospital Stay (HOSPITAL_COMMUNITY)
Admission: AD | Admit: 2012-08-02 | Discharge: 2012-08-02 | Disposition: A | Discharge status: Home / Self Care | Payer: BC Managed Care – PPO | Source: Ambulatory Visit | Attending: Family Medicine | Admitting: Family Medicine

## 2012-08-02 ENCOUNTER — Encounter (HOSPITAL_COMMUNITY): Payer: Self-pay | Admitting: Obstetrics and Gynecology

## 2012-08-02 DIAGNOSIS — F5089 Other specified eating disorder: Secondary | ICD-10-CM

## 2012-08-02 DIAGNOSIS — O10019 Pre-existing essential hypertension complicating pregnancy, unspecified trimester: Secondary | ICD-10-CM

## 2012-08-02 DIAGNOSIS — O24913 Unspecified diabetes mellitus in pregnancy, third trimester: Secondary | ICD-10-CM

## 2012-08-02 DIAGNOSIS — O09523 Supervision of elderly multigravida, third trimester: Secondary | ICD-10-CM

## 2012-08-02 DIAGNOSIS — O24919 Unspecified diabetes mellitus in pregnancy, unspecified trimester: Secondary | ICD-10-CM

## 2012-08-02 DIAGNOSIS — Z3689 Encounter for other specified antenatal screening: Secondary | ICD-10-CM

## 2012-08-02 DIAGNOSIS — O10013 Pre-existing essential hypertension complicating pregnancy, third trimester: Secondary | ICD-10-CM

## 2012-08-02 NOTE — MAU Note (Signed)
Pt here for NST and B/P check

## 2012-08-02 NOTE — MAU Provider Note (Signed)
History     CSN: [id number]  Arrival date and time: 08/02/12 1020   First Provider Initiated Contact with Patient 08/02/12 1127      Chief Complaint  Patient presents with  . Non-stress Test   HPI Brittany Grant is a 42 y.o. [phone] female at [redacted]w[redacted]d who presents stating she is here for nst and bp check.  Was here on Wed and had a bpp, and was told to come back today b/c next visit wasn't until tues, and it would be too long to go w/o antenatal testing.  Reports good fm. Denies uc's, vb, or lof.  Denies ha, scotomata, ruq/epigastric pain, n/v.  Next appt at [facility] on Tues.   OB History   Grav Para Term Preterm Abortions TAB SAB Ect Mult Living   4 2 2  0 1 0 1 0 0 2      Past Medical History  Diagnosis Date  . Hypertension   . GERD (gastroesophageal reflux disease)   . IUD 04/26/2010  . Obesity   . Scarring, keloid   . Hemorrhoid     Past Surgical History  Procedure Laterality Date  . Keloid excision      Family History  Problem Relation Age of Onset  . Hypertension Other   . Diabetes Mother   . Diabetes Father   . Hypertension Mother   . Hypertension Father   . Hypertension Father     History  Substance Use Topics  . Smoking status: Former Smoker -- 10 years    Types: Cigarettes    Quit date: 01/23/2012  . Smokeless tobacco: Never Used     Comment: smoking 1/4 pack per day  . Alcohol Use: No    Allergies: No Known Allergies  Prescriptions prior to admission  Medication Sig Dispense Refill  . acetaminophen (TYLENOL) 500 MG tablet Take 1,000 mg by mouth every 6 (six) hours as needed for pain.      Marland Kitchen glyBURIDE (DIABETA) 2.5 MG tablet 5 mg 3 (three) times daily with meals. Take 1 tablet in the morning and 2 tablet at bedtime      . labetalol (NORMODYNE) 200 MG tablet Take 1 tablet (200 mg total) by mouth 2 (two) times daily.  60 tablet  3  . ondansetron (ZOFRAN) 4 MG tablet Take 1 tablet (4 mg total) by mouth every 8 (eight) hours as needed for nausea.  30  tablet  1  . pantoprazole (PROTONIX) 40 MG tablet Take 1 tablet (40 mg total) by mouth daily.  30 tablet  1  . Prenatal Vit-Fe Fumarate-FA (PRENATABS FA) TABS Take 1 tablet by mouth daily.  30 each  5    Review of Systems  Constitutional: Negative.   Eyes: Negative.  Negative for blurred vision and double vision.  Respiratory: Negative.   Cardiovascular: Negative.   Gastrointestinal: Positive for heartburn (better w/ antacid). Negative for nausea, vomiting and abdominal pain.  Genitourinary: Negative.   Musculoskeletal: Negative.   Skin: Negative.   Neurological: Positive for headaches ('i always have headaches' x 3 months, better w/apap).  Endo/Heme/Allergies: Negative.   Psychiatric/Behavioral: Negative.    Physical Exam   Blood pressure 118/81, pulse 80, temperature 97.6 F (36.4 C), resp. rate 18, height 5' (1.524 m), weight 95.074 kg (209 lb 9.6 oz), last menstrual period 11/09/2011.  Physical Exam  Constitutional: She is oriented to person, place, and time. She appears well-developed and well-nourished.  HENT:  Head: Normocephalic.  Neck: Normal range of motion.  Cardiovascular: Normal  rate.   Respiratory: Effort normal.  GI: Soft.  Genitourinary:  deferred  Musculoskeletal: Normal range of motion.  Neurological: She is alert and oriented to person, place, and time. She has normal reflexes.  Skin: Skin is warm and dry.  Psychiatric: She has a normal mood and affect. Her behavior is normal. Judgment and thought content normal.   FHR: 125, mod variability, 15x15 accels, no decels=Cat I UCs: ui  MAU Course  Procedures  NST Exam  Assessment and Plan  A:  [redacted]w[redacted]d [facility]UP  [id number]   A2DM  CHTN  AMA  Cat I Reactive NST P:  D/C home  Keep appt at [facility] as scheduled on Tues  Reviewed labor s/s, pre-e s/s, and fetal kick counts  Discussed reasons to return to [employer] 08/02/2012, 11:29 AM

## 2012-08-05 ENCOUNTER — Encounter: Payer: Self-pay | Admitting: Family Medicine

## 2012-08-05 ENCOUNTER — Ambulatory Visit (INDEPENDENT_AMBULATORY_CARE_PROVIDER_SITE_OTHER): Payer: BC Managed Care – PPO | Admitting: Family Medicine

## 2012-08-05 VITALS — BP 126/93 | Wt 208.0 lb

## 2012-08-05 DIAGNOSIS — O169 Unspecified maternal hypertension, unspecified trimester: Secondary | ICD-10-CM

## 2012-08-05 DIAGNOSIS — O163 Unspecified maternal hypertension, third trimester: Secondary | ICD-10-CM

## 2012-08-05 DIAGNOSIS — O24919 Unspecified diabetes mellitus in pregnancy, unspecified trimester: Secondary | ICD-10-CM

## 2012-08-05 DIAGNOSIS — O24913 Unspecified diabetes mellitus in pregnancy, third trimester: Secondary | ICD-10-CM

## 2012-08-05 DIAGNOSIS — O368190 Decreased fetal movements, unspecified trimester, not applicable or unspecified: Secondary | ICD-10-CM

## 2012-08-05 DIAGNOSIS — O368130 Decreased fetal movements, third trimester, not applicable or unspecified: Secondary | ICD-10-CM

## 2012-08-05 DIAGNOSIS — Z3480 Encounter for supervision of other normal pregnancy, unspecified trimester: Secondary | ICD-10-CM

## 2012-08-05 NOTE — Progress Notes (Signed)
NST reviewed and reactive. No BS again.  For IOL at 39 wks

## 2012-08-05 NOTE — Assessment & Plan Note (Signed)
Risks reviewed.

## 2012-08-05 NOTE — Progress Notes (Signed)
P = 77 

## 2012-08-05 NOTE — Patient Instructions (Signed)
Pregnancy - Third Trimester The third trimester of pregnancy (the last 3 months) is a period of the most rapid growth for you and your baby. The baby approaches a length of 20 inches and a weight of 6 to 10 pounds. The baby is adding on fat and getting ready for life outside your body. While inside, babies have periods of sleeping and waking, sucking thumbs, and hiccuping. You can often feel small contractions of the uterus. This is false labor. It is also called Braxton-Hicks contractions. This is like a practice for labor. The usual problems in this stage of pregnancy include more difficulty breathing, swelling of the hands and feet from water retention, and having to urinate more often because of the uterus and baby pressing on your bladder.  PRENATAL EXAMS  Blood work may continue to be done during prenatal exams. These tests are done to check on your health and the probable health of your baby. Blood work is used to follow your blood levels (hemoglobin). Anemia (low hemoglobin) is common during pregnancy. Iron and vitamins are given to help prevent this. You may also continue to be checked for diabetes. Some of the past blood tests may be done again.  The size of the uterus is measured during each visit. This makes sure your baby is growing properly according to your pregnancy dates.  Your blood pressure is checked every prenatal visit. This is to make sure you are not getting toxemia.  Your urine is checked every prenatal visit for infection, diabetes, and protein.  Your weight is checked at each visit. This is done to make sure gains are happening at the suggested rate and that you and your baby are growing normally.  Sometimes, an ultrasound is performed to confirm the position and the proper growth and development of the baby. This is a test done that bounces harmless sound waves off the baby so your caregiver can more accurately determine a due date.  Discuss the type of pain medicine and  anesthesia you will have during your labor and delivery.  Discuss the possibility and anesthesia if a cesarean section might be necessary.  Inform your caregiver if there is any mental or physical violence at home. Sometimes, a specialized non-stress test, contraction stress test, and biophysical profile are done to make sure the baby is not having a problem. Checking the amniotic fluid surrounding the baby is called an amniocentesis. The amniotic fluid is removed by sticking a needle into the belly (abdomen). This is sometimes done near the end of pregnancy if an early delivery is required. In this case, it is done to help make sure the baby's lungs are mature enough for the baby to live outside of the womb. If the lungs are not mature and it is unsafe to deliver the baby, an injection of cortisone medicine is given to the mother 1 to 2 days before the delivery. This helps the baby's lungs mature and makes it safer to deliver the baby. CHANGES OCCURING IN THE THIRD TRIMESTER OF PREGNANCY Your body goes through many changes during pregnancy. They vary from person to person. Talk to your caregiver about changes you notice and are concerned about.  During the last trimester, you have probably had an increase in your appetite. It is normal to have cravings for certain foods. This varies from person to person and pregnancy to pregnancy.  You may begin to get stretch marks on your hips, abdomen, and breasts. These are normal changes in the body   during pregnancy. There are no exercises or medicines to take which prevent this change.  Constipation may be treated with a stool softener or adding bulk to your diet. Drinking lots of fluids, fiber in vegetables, fruits, and whole grains are helpful.  Exercising is also helpful. If you have been very active up until your pregnancy, most of these activities can be continued during your pregnancy. If you have been less active, it is helpful to start an exercise  program such as walking. Consult your caregiver before starting exercise programs.  Avoid all smoking, alcohol, non-prescribed drugs, herbs and "street drugs" during your pregnancy. These chemicals affect the formation and growth of the baby. Avoid chemicals throughout the pregnancy to ensure the delivery of a healthy infant.  Backache, varicose veins, and hemorrhoids may develop or get worse.  You will tire more easily in the third trimester, which is normal.  The baby's movements may be stronger and more often.  You may become short of breath easily.  Your belly button may stick out.  A yellow discharge may leak from your breasts called colostrum.  You may have a bloody mucus discharge. This usually occurs a few days to a week before labor begins. HOME CARE INSTRUCTIONS   Keep your caregiver's appointments. Follow your caregiver's instructions regarding medicine use, exercise, and diet.  During pregnancy, you are providing food for you and your baby. Continue to eat regular, well-balanced meals. Choose foods such as meat, fish, milk and other low fat dairy products, vegetables, fruits, and whole-grain breads and cereals. Your caregiver will tell you of the ideal weight gain.  A physical sexual relationship may be continued throughout pregnancy if there are no other problems such as early (premature) leaking of amniotic fluid from the membranes, vaginal bleeding, or belly (abdominal) pain.  Exercise regularly if there are no restrictions. Check with your caregiver if you are unsure of the safety of your exercises. Greater weight gain will occur in the last 2 trimesters of pregnancy. Exercising helps:  Control your weight.  Get you in shape for labor and delivery.  You lose weight after you deliver.  Rest a lot with legs elevated, or as needed for leg cramps or low back pain.  Wear a good support or jogging bra for breast tenderness during pregnancy. This may help if worn during  sleep. Pads or tissues may be used in the bra if you are leaking colostrum.  Do not use hot tubs, steam rooms, or saunas.  Wear your seat belt when driving. This protects you and your baby if you are in an accident.  Avoid raw meat, cat litter boxes and soil used by cats. These carry germs that can cause birth defects in the baby.  It is easier to leak urine during pregnancy. Tightening up and strengthening the pelvic muscles will help with this problem. You can practice stopping your urination while you are going to the bathroom. These are the same muscles you need to strengthen. It is also the muscles you would use if you were trying to stop from passing gas. You can practice tightening these muscles up 10 times a set and repeating this about 3 times per day. Once you know what muscles to tighten up, do not perform these exercises during urination. It is more likely to cause an infection by backing up the urine.  Ask for help if you have financial, counseling, or nutritional needs during pregnancy. Your caregiver will be able to offer counseling for these   needs as well as refer you for other special needs.  Make a list of emergency phone numbers and have them available.  Plan on getting help from family or friends when you go home from the hospital.  Make a trial run to the hospital.  Take prenatal classes with the father to understand, practice, and ask questions about the labor and delivery.  Prepare the baby's room or nursery.  Do not travel out of the city unless it is absolutely necessary and with the advice of your caregiver.  Wear only low or no heal shoes to have better balance and prevent falling. MEDICINES AND DRUG USE IN PREGNANCY  Take prenatal vitamins as directed. The vitamin should contain 1 milligram of folic acid. Keep all vitamins out of reach of children. Only a couple vitamins or tablets containing iron may be fatal to a baby or young child when ingested.  Avoid use  of all medicines, including herbs, over-the-counter medicines, not prescribed or suggested by your caregiver. Only take over-the-counter or prescription medicines for pain, discomfort, or fever as directed by your caregiver. Do not use aspirin, ibuprofen or naproxen unless approved by your caregiver.  Let your caregiver also know about herbs you may be using.  Alcohol is related to a number of birth defects. This includes fetal alcohol syndrome. All alcohol, in any form, should be avoided completely. Smoking will cause low birth rate and premature babies.  Illegal drugs are very harmful to the baby. They are absolutely forbidden. A baby born to an addicted mother will be addicted at birth. The baby will go through the same withdrawal an adult does. SEEK MEDICAL CARE IF: You have any concerns or worries during your pregnancy. It is better to call with your questions if you feel they cannot wait, rather than worry about them. SEEK IMMEDIATE MEDICAL CARE IF:   An unexplained oral temperature above 102 F (38.9 C) develops, or as your caregiver suggests.  You have leaking of fluid from the vagina. If leaking membranes are suspected, take your temperature and tell your caregiver of this when you call.  There is vaginal spotting, bleeding or passing clots. Tell your caregiver of the amount and how many pads are used.  You develop a bad smelling vaginal discharge with a change in the color from clear to white.  You develop vomiting that lasts more than 24 hours.  You develop chills or fever.  You develop shortness of breath.  You develop burning on urination.  You loose more than 2 pounds of weight or gain more than 2 pounds of weight or as suggested by your caregiver.  You notice sudden swelling of your face, hands, and feet or legs.  You develop belly (abdominal) pain. Round ligament discomfort is a common non-cancerous (benign) cause of abdominal pain in pregnancy. Your caregiver still  must evaluate you.  You develop a severe headache that does not go away.  You develop visual problems, blurred or double vision.  If you have not felt your baby move for more than 1 hour. If you think the baby is not moving as much as usual, eat something with sugar in it and lie down on your left side for an hour. The baby should move at least 4 to 5 times per hour. Call right away if your baby moves less than that.  You fall, are in a car accident, or any kind of trauma.  There is mental or physical violence at home. Document Released: 02/06/2001   Document Revised: 11/07/2011 Document Reviewed: 08/11/2008 [employer] Patient Information 2014 [employer].  Breastfeeding A change in hormones during your pregnancy causes growth of your breast tissue and an increase in number and size of milk ducts. The hormone prolactin allows proteins, sugars, and fats from your blood supply to make breast milk in your milk-producing glands. The hormone progesterone prevents breast milk from being released before the birth of your baby. After the birth of your baby, your progesterone level decreases allowing breast milk to be released. Thoughts of your baby, as well as his or her sucking or crying, can stimulate the release of milk from the milk-producing glands. Deciding to breastfeed (nurse) is one of the best choices you can make for you and your baby. The information that follows gives a brief review of the benefits, as well as other important skills to know about breastfeeding. BENEFITS OF BREASTFEEDING For your baby  The first milk (colostrum) helps your baby's digestive system function better.   There are antibodies in your milk that help your baby fight off infections.   Your baby has a lower incidence of asthma, allergies, and sudden infant death syndrome (SIDS).   The nutrients in breast milk are better for your baby than infant formulas.  Breast milk improves your baby's brain development.    Your baby will have less gas, colic, and constipation.  Your baby is less likely to develop other conditions, such as childhood obesity, asthma, or diabetes mellitus. For you  Breastfeeding helps develop a very special bond between you and your baby.   Breastfeeding is convenient, always available at the correct temperature, and costs nothing.   Breastfeeding helps to burn calories and helps you lose the weight gained during pregnancy.   Breastfeeding makes your uterus contract back down to normal size faster and slows bleeding following delivery.   Breastfeeding mothers have a lower risk of developing osteoporosis or breast or ovarian cancer later in life.  BREASTFEEDING FREQUENCY  A healthy, full-term baby may breastfeed as often as every hour or space his or her feedings to every 3 hours. Breastfeeding frequency will vary from baby to baby.   Newborns should be fed no less than every 2 3 hours during the day and every 4 5 hours during the night. You should breastfeed a minimum of 8 feedings in a 24 hour period.  Awaken your baby to breastfeed if it has been 3 4 hours since the last feeding.  Breastfeed when you feel the need to reduce the fullness of your breasts or when your newborn shows signs of hunger. Signs that your baby may be hungry include:  Increased alertness or activity.  Stretching.  Movement of the head from side to side.  Movement of the head and opening of the mouth when the corner of the mouth or cheek is stroked (rooting).  Increased sucking sounds, smacking lips, cooing, sighing, or squeaking.  Hand-to-mouth movements.  Increased sucking of fingers or hands.  Fussing.  Intermittent crying.  Signs of extreme hunger will require calming and consoling before you try to feed your baby. Signs of extreme hunger may include:  Restlessness.  A loud, strong cry.  Screaming.  Frequent feeding will help you make more milk and will help prevent  problems, such as sore nipples and engorgement of the breasts.  BREASTFEEDING   Whether lying down or sitting, be sure that the baby's abdomen is facing your abdomen.   Support your breast with 4 fingers under your breast   and your thumb above your nipple. Make sure your fingers are well away from your nipple and your baby's mouth.   Stroke your baby's lips gently with your finger or nipple.   When your baby's mouth is open wide enough, place all of your nipple and as much of the colored area around your nipple (areola) as possible into your baby's mouth.  More areola should be visible above his or her upper lip than below his or her lower lip.  Your baby's tongue should be between his or her lower gum and your breast.  Ensure that your baby's mouth is correctly positioned around the nipple (latched). Your baby's lips should create a seal on your breast.  Signs that your baby has effectively latched onto your nipple include:  Tugging or sucking without pain.  Swallowing heard between sucks.  Absent click or smacking sound.  Muscle movement above and in front of his or her ears with sucking.  Your baby must suck about 2 3 minutes in order to get your milk. Allow your baby to feed on each breast as long as he or she wants. Nurse your baby until he or she unlatches or falls asleep at the first breast, then offer the second breast.  Signs that your baby is full and satisfied include:  A gradual decrease in the number of sucks or complete cessation of sucking.  Falling asleep.  Extension or relaxation of his or her body.  Retention of a small amount of milk in his or her mouth.  Letting go of your breast by himself or herself.  Signs of effective breastfeeding in you include:  Breasts that have increased firmness, weight, and size prior to feeding.  Breasts that are softer after nursing.  Increased milk volume, as well as a change in milk consistency and color by the 5th  day of breastfeeding.  Breast fullness relieved by breastfeeding.  Nipples are not sore, cracked, or bleeding.  If needed, break the suction by putting your finger into the corner of your baby's mouth and sliding your finger between his or her gums. Then, remove your breast from his or her mouth.  It is common for babies to spit up a small amount after a feeding.  Babies often swallow air during feeding. This can make babies fussy. Burping your baby between breasts can help with this.  Vitamin D supplements are recommended for babies who get only breast milk.  Avoid using a pacifier during your baby's first 4 6 weeks.  Avoid supplemental feedings of water, formula, or juice in place of breastfeeding. Breast milk is all the food your baby needs. It is not necessary for your baby to have water or formula. Your breasts will make more milk if supplemental feedings are avoided during the early weeks. HOW TO TELL WHETHER YOUR BABY IS GETTING ENOUGH BREAST MILK Wondering whether or not your baby is getting enough milk is a common concern among mothers. You can be assured that your baby is getting enough milk if:   Your baby is actively sucking and you hear swallowing.   Your baby seems relaxed and satisfied after a feeding.   Your baby nurses at least 8 12 times in a 24 hour time period.  During the first 3 5 days of age:  Your baby is wetting at least 3 5 diapers in a 24 hour period. The urine should be clear and pale yellow.  Your baby is having at least 3 4 stools in   a 24 hour period. The stool should be soft and yellow.  At 5 7 days of age, your baby is having at least 3 6 stools in a 24 hour period. The stool should be seedy and yellow by 5 days of age.  Your baby has a weight loss less than 7 10% during the first 3 days of age.  Your baby does not lose weight after 3 7 days of age.  Your baby gains 4 7 ounces each week after he or she is 4 days of age.  Your baby gains weight  by 5 days of age and is back to birth weight within 2 weeks. ENGORGEMENT In the first week after your baby is born, you may experience extremely full breasts (engorgement). When engorged, your breasts may feel heavy, warm, or tender to the touch. Engorgement peaks within 24 48 hours after delivery of your baby.  Engorgement may be reduced by:  Continuing to breastfeed.  Increasing the frequency of breastfeeding.  Taking warm showers or applying warm, moist heat to your breasts just before each feeding. This increases circulation and helps the milk flow.   Gently massaging your breast before and during the feedings. With your fingertips, massage from your chest wall towards your nipple in a circular motion.   Ensuring that your baby empties at least one breast at every feeding. It also helps to start the next feeding on the opposite breast.   Expressing breast milk by hand or by using a breast pump to empty the breasts if your baby is sleepy, or not nursing well. You may also want to express milk if you are returning to work oryou feel you are getting engorged.  Ensuring your baby is latched on and positioned properly while breastfeeding. If you follow these suggestions, your engorgement should improve in 24 48 hours. If you are still experiencing difficulty, call your lactation consultant or caregiver.  CARING FOR YOURSELF Take care of your breasts.  Bathe or shower daily.   Avoid using soap on your nipples.   Wear a supportive bra. Avoid wearing underwire style bras.  Air dry your nipples for a 3 4minutes after each feeding.   Use only cotton bra pads to absorb breast milk leakage. Leaking of breast milk between feedings is normal.   Use only pure lanolin on your nipples after nursing. You do not need to wash it off before feeding your baby again. Another option is to express a few drops of breast milk and gently massage that milk into your nipples.  Continue breast  self-awareness checks. Take care of yourself.  Eat healthy foods. Alternate 3 meals with 3 snacks.  Avoid foods that you notice affect your baby in a bad way.  Drink milk, fruit juice, and water to satisfy your thirst (about 8 glasses a day).   Rest often, relax, and take your prenatal vitamins to prevent fatigue, stress, and anemia.  Avoid chewing and smoking tobacco.  Avoid alcohol and drug use.  Take over-the-counter and prescribed medicine only as directed by your caregiver or pharmacist. You should always check with your caregiver or pharmacist before taking any new medicine, vitamin, or herbal supplement.  Know that pregnancy is possible while breastfeeding. If desired, talk to your caregiver about family planning and safe birth control methods that may be used while breastfeeding. SEEK MEDICAL CARE IF:   You feel like you want to stop breastfeeding or have become frustrated with breastfeeding.  You have painful breasts or nipples.    Your nipples are cracked or bleeding.  Your breasts are red, tender, or warm.  You have a swollen area on either breast.  You have a fever or chills.  You have nausea or vomiting.  You have drainage from your nipples.  Your breasts do not become full before feedings by the 5th day after delivery.  You feel sad and depressed.  Your baby is too sleepy to eat well.  Your baby is having trouble sleeping.   Your baby is wetting less than 3 diapers in a 24 hour period.  Your baby has less than 3 stools in a 24 hour period.  Your baby's skin or the white part of his or her eyes becomes more yellow.   Your baby is not gaining weight by 5 days of age. MAKE SURE YOU:   Understand these instructions.  Will watch your condition.  Will get help right away if you are not doing well or get worse. Document Released: 02/12/2005 Document Revised: 11/07/2011 Document Reviewed: 09/19/2011 [employer] Patient Information 2014 [employer].  

## 2012-08-05 NOTE — Progress Notes (Signed)
GBS result is negative, will scan in result through epic.  Result did not drop in from [place].  Original copy faxed to [place].

## 2012-08-06 ENCOUNTER — Encounter: Payer: Self-pay | Admitting: Obstetrics and Gynecology

## 2012-08-08 ENCOUNTER — Inpatient Hospital Stay (HOSPITAL_COMMUNITY)
Admission: RE | Admit: 2012-08-08 | Discharge: 2012-08-10 | DRG: 372 | Disposition: A | Discharge status: Home / Self Care | Payer: BC Managed Care – PPO | Source: Ambulatory Visit | Attending: Obstetrics and Gynecology | Admitting: Obstetrics and Gynecology

## 2012-08-08 ENCOUNTER — Encounter (HOSPITAL_COMMUNITY): Payer: Self-pay

## 2012-08-08 VITALS — BP 136/93 | HR 73 | Temp 97.3°F | Resp 18 | Ht 60.0 in | Wt 208.0 lb

## 2012-08-08 DIAGNOSIS — O99814 Abnormal glucose complicating childbirth: Secondary | ICD-10-CM | POA: Diagnosis present

## 2012-08-08 DIAGNOSIS — O09523 Supervision of elderly multigravida, third trimester: Secondary | ICD-10-CM

## 2012-08-08 DIAGNOSIS — O10013 Pre-existing essential hypertension complicating pregnancy, third trimester: Secondary | ICD-10-CM

## 2012-08-08 DIAGNOSIS — O09529 Supervision of elderly multigravida, unspecified trimester: Secondary | ICD-10-CM | POA: Diagnosis present

## 2012-08-08 DIAGNOSIS — O24913 Unspecified diabetes mellitus in pregnancy, third trimester: Secondary | ICD-10-CM

## 2012-08-08 DIAGNOSIS — F5089 Other specified eating disorder: Secondary | ICD-10-CM

## 2012-08-08 DIAGNOSIS — O1002 Pre-existing essential hypertension complicating childbirth: Principal | ICD-10-CM | POA: Diagnosis present

## 2012-08-08 LAB — CBC
HCT: 32.6 % — ABNORMAL LOW (ref 36.0–46.0)
Hemoglobin: 10.9 g/dL — ABNORMAL LOW (ref 12.0–15.0)
MCH: 22.9 pg — ABNORMAL LOW (ref 26.0–34.0)
MCHC: 33.4 g/dL (ref 30.0–36.0)
RBC: 4.75 MIL/uL (ref 3.87–5.11)

## 2012-08-08 MED ORDER — GLYBURIDE 2.5 MG PO TABS
2.5000 mg | ORAL_TABLET | Freq: Every day | ORAL | Status: DC
  Filled 2012-08-08: qty 1

## 2012-08-08 MED ORDER — LACTATED RINGERS IV SOLN
INTRAVENOUS | Status: DC
  Administered 2012-08-08 – 2012-08-09 (×2): via INTRAVENOUS

## 2012-08-08 MED ORDER — ONDANSETRON HCL 4 MG/2ML IJ SOLN: 4.0000 mg | Freq: Four times a day (QID) | INTRAMUSCULAR | Status: DC | PRN

## 2012-08-08 MED ORDER — OXYCODONE-ACETAMINOPHEN 5-325 MG PO TABS
1.0000 | ORAL_TABLET | ORAL | Status: DC | PRN
  Administered 2012-08-09: 2 via ORAL
  Filled 2012-08-08: qty 2

## 2012-08-08 MED ORDER — OXYTOCIN BOLUS FROM INFUSION
500.0000 mL | INTRAVENOUS | Status: DC
  Administered 2012-08-09: 500 mL via INTRAVENOUS

## 2012-08-08 MED ORDER — CITRIC ACID-SODIUM CITRATE 334-500 MG/5ML PO SOLN
30.0000 mL | ORAL | Status: DC | PRN
  Filled 2012-08-08: qty 15

## 2012-08-08 MED ORDER — OXYTOCIN 40 UNITS IN LACTATED RINGERS INFUSION - SIMPLE MED
62.5000 mL/h | INTRAVENOUS | Status: DC
  Filled 2012-08-08: qty 1000

## 2012-08-08 MED ORDER — TERBUTALINE SULFATE 1 MG/ML IJ SOLN: 0.2500 mg | Freq: Once | INTRAMUSCULAR | Status: AC | PRN

## 2012-08-08 MED ORDER — LIDOCAINE HCL (PF) 1 % IJ SOLN
30.0000 mL | INTRAMUSCULAR | Status: DC | PRN
  Filled 2012-08-08 (×2): qty 30

## 2012-08-08 MED ORDER — GLYBURIDE 5 MG PO TABS
5.0000 mg | ORAL_TABLET | Freq: Every day | ORAL | Status: DC
  Administered 2012-08-08: 5 mg via ORAL
  Filled 2012-08-08: qty 1

## 2012-08-08 MED ORDER — ACETAMINOPHEN 325 MG PO TABS: 650.0000 mg | ORAL_TABLET | ORAL | Status: DC | PRN

## 2012-08-08 MED ORDER — LABETALOL HCL 200 MG PO TABS
200.0000 mg | ORAL_TABLET | Freq: Two times a day (BID) | ORAL | Status: DC
  Administered 2012-08-08 – 2012-08-09 (×2): 200 mg via ORAL
  Filled 2012-08-08 (×2): qty 1

## 2012-08-08 MED ORDER — FLEET ENEMA 7-19 GM/118ML RE ENEM: 1.0000 | ENEMA | RECTAL | Status: DC | PRN

## 2012-08-08 MED ORDER — IBUPROFEN 600 MG PO TABS: 600.0000 mg | ORAL_TABLET | Freq: Four times a day (QID) | ORAL | Status: DC | PRN

## 2012-08-08 MED ORDER — LACTATED RINGERS IV SOLN: 500.0000 mL | INTRAVENOUS | Status: DC | PRN

## 2012-08-08 MED ORDER — MISOPROSTOL 200 MCG PO TABS
50.0000 ug | ORAL_TABLET | ORAL | Status: DC
  Administered 2012-08-08 – 2012-08-09 (×2): 50 ug via ORAL
  Filled 2012-08-08 (×2): qty 0.5

## 2012-08-08 NOTE — H&P (Signed)
Brittany Grant is a 42 y.o. female, [id number], [redacted]w[redacted]d, presenting for induction of labor due to A2GDM and CHTN.  Maternal Medical History:  Reason for admission: Nausea.  Contractions: Frequency: regular.   Perceived severity is mild.    Fetal activity: Perceived fetal activity is normal.   Last perceived fetal movement was within the past hour.    Prenatal complications: PIH (Chronic hypertension).   Prenatal Complications - Diabetes: gestational. Diabetes is managed by oral agent (monotherapy).      OB History   Grav Para Term Preterm Abortions TAB SAB Ect Mult Living   4 2 2  0 1 0 1 0 0 2     Past Medical History  Diagnosis Date  . Hypertension   . GERD (gastroesophageal reflux disease)   . IUD 04/26/2010  . Obesity   . Scarring, keloid   . Hemorrhoid    Past Surgical History  Procedure Laterality Date  . Keloid excision     Family History: family history includes Diabetes in her father and mother and Hypertension in her fathers, mother, and other. Social History:  reports that she quit smoking about 6 months ago. Her smoking use included Cigarettes. She smoked 0.00 packs per day for 10 years. She has never used smokeless tobacco. She reports that she does not drink alcohol or use illicit drugs.   Prenatal Transfer Tool  Maternal Diabetes: Yes:  Diabetes Type:  Insulin/Medication controlled Gestational Genetic Screening: Normal [place] screen Maternal Ultrasounds/Referrals: Normal Fetal Ultrasounds or other Referrals:  None Maternal Substance Abuse:  No Significant Maternal Medications:  Meds include: Other: Glyburide Significant Maternal Lab Results:  None Other Comments:  None  Review of Systems  Constitutional: Negative for fever and chills.  Eyes: Negative for blurred vision.  Respiratory: Negative for shortness of breath.   Cardiovascular: Negative for chest pain and leg swelling.  Gastrointestinal: Negative for nausea, vomiting and abdominal pain.     Dilation: 1 Effacement (%): 50 Station: Ballotable Exam by:: [employer] Blood pressure 137/88, pulse 81, temperature 98.3 F (36.8 C), temperature source Oral, resp. rate 20, height 5' (1.524 m), weight 94.348 kg (208 lb), last menstrual period 11/09/2011. Maternal Exam:  Abdomen: Fetal presentation: vertex  Pelvis: adequate for delivery.   Cervix: Cervix evaluated by digital exam.     Physical Exam  Constitutional: She appears well-developed and well-nourished.  Cardiovascular: Normal rate, regular rhythm, normal heart sounds and intact distal pulses.   No murmur heard. Respiratory: Effort normal and breath sounds normal. No respiratory distress.  GI: Soft. She exhibits no distension. There is no tenderness.  Musculoskeletal: She exhibits no edema and no tenderness.  EFM: 145 +accels, no decels Rare ctx per toco; no pattern   Prenatal labs: ABO, Rh: O/POS/-- (10/21 1607) Antibody: NEG (10/21 1607) Rubella: 113.9 (10/21 1607) RPR: NON REAC (10/21 1607)  HBsAg: NEGATIVE (10/21 1607)  HIV: NON REACTIVE (10/21 1607)  GBS: Negative (05/30 0000)   Assessment/Plan: Ax: 1. IUP at [redacted]w[redacted]d 2. A2GDM 3. CHTN 4. Unfavorable cervix  Plan: Cytotec 50 mg orally q 4 hrs Continue Labetalol and Glyburide Re-evaluate cervix  Continue to monitor  GARLAND, JEANNA 08/08/2012, 10:49 PM  I have seen and examined this patient and I agree with the above. Cam Hai 11:52 PM 08/08/2012

## 2012-08-09 ENCOUNTER — Encounter (HOSPITAL_COMMUNITY): Payer: Self-pay

## 2012-08-09 DIAGNOSIS — O99814 Abnormal glucose complicating childbirth: Secondary | ICD-10-CM

## 2012-08-09 DIAGNOSIS — O1002 Pre-existing essential hypertension complicating childbirth: Secondary | ICD-10-CM

## 2012-08-09 LAB — GLUCOSE, CAPILLARY
Glucose-Capillary: 60 mg/dL — ABNORMAL LOW (ref 70–99)
Glucose-Capillary: 65 mg/dL — ABNORMAL LOW (ref 70–99)
Glucose-Capillary: 67 mg/dL — ABNORMAL LOW (ref 70–99)
Glucose-Capillary: 69 mg/dL — ABNORMAL LOW (ref 70–99)
Glucose-Capillary: 119 mg/dL — ABNORMAL HIGH (ref 70–99)
Glucose-Capillary: 118 mg/dL — ABNORMAL HIGH (ref 70–99)

## 2012-08-09 LAB — TYPE AND SCREEN: Antibody Screen: NEGATIVE

## 2012-08-09 LAB — CBC
MCH: 22.6 pg — ABNORMAL LOW (ref 26.0–34.0)
MCHC: 32.9 g/dL (ref 30.0–36.0)
MCV: 68.7 fL — ABNORMAL LOW (ref 78.0–100.0)
Platelets: 172 10*3/uL (ref 150–400)
RDW: 15.9 % — ABNORMAL HIGH (ref 11.5–15.5)

## 2012-08-09 LAB — ABO/RH: ABO/RH(D): O POS

## 2012-08-09 MED ORDER — ONDANSETRON HCL 4 MG/2ML IJ SOLN: 4.0000 mg | INTRAMUSCULAR | Status: DC | PRN

## 2012-08-09 MED ORDER — DIBUCAINE 1 % RE OINT
1.0000 "application " | TOPICAL_OINTMENT | RECTAL | Status: DC | PRN
  Administered 2012-08-09: 1 via RECTAL
  Filled 2012-08-09: qty 28

## 2012-08-09 MED ORDER — DEXTROSE IN LACTATED RINGERS 5 % IV SOLN
INTRAVENOUS | Status: DC
  Administered 2012-08-09: 10:00:00 via INTRAVENOUS

## 2012-08-09 MED ORDER — LACTATED RINGERS IV SOLN
500.0000 mL | Freq: Once | INTRAVENOUS | Status: AC
  Administered 2012-08-09: 500 mL via INTRAVENOUS

## 2012-08-09 MED ORDER — FENTANYL 2.5 MCG/ML BUPIVACAINE 1/10 % EPIDURAL INFUSION (WH - ANES): 14.0000 mL/h | INTRAMUSCULAR | Status: DC | PRN

## 2012-08-09 MED ORDER — PHENYLEPHRINE 40 MCG/ML (10ML) SYRINGE FOR IV PUSH (FOR BLOOD PRESSURE SUPPORT)
80.0000 ug | PREFILLED_SYRINGE | INTRAVENOUS | Status: DC | PRN
  Filled 2012-08-09: qty 2

## 2012-08-09 MED ORDER — FENTANYL CITRATE 0.05 MG/ML IJ SOLN: 100.0000 ug | INTRAMUSCULAR | Status: DC | PRN

## 2012-08-09 MED ORDER — BENZOCAINE-MENTHOL 20-0.5 % EX AERO
1.0000 "application " | INHALATION_SPRAY | CUTANEOUS | Status: DC | PRN
  Administered 2012-08-09: 1 via TOPICAL
  Filled 2012-08-09: qty 56

## 2012-08-09 MED ORDER — TETANUS-DIPHTH-ACELL PERTUSSIS 5-2.5-18.5 LF-MCG/0.5 IM SUSP: 0.5000 mL | Freq: Once | INTRAMUSCULAR | Status: DC

## 2012-08-09 MED ORDER — PRENATAL MULTIVITAMIN CH
1.0000 | ORAL_TABLET | Freq: Every day | ORAL | Status: DC
  Administered 2012-08-09 – 2012-08-10 (×2): 1 via ORAL
  Filled 2012-08-09 (×2): qty 1

## 2012-08-09 MED ORDER — SODIUM CHLORIDE 0.9 % IJ SOLN: 3.0000 mL | Freq: Two times a day (BID) | INTRAMUSCULAR | Status: DC

## 2012-08-09 MED ORDER — SIMETHICONE 80 MG PO CHEW: 80.0000 mg | CHEWABLE_TABLET | ORAL | Status: DC | PRN

## 2012-08-09 MED ORDER — DIPHENHYDRAMINE HCL 50 MG/ML IJ SOLN: 12.5000 mg | INTRAMUSCULAR | Status: DC | PRN

## 2012-08-09 MED ORDER — EPHEDRINE 5 MG/ML INJ
10.0000 mg | INTRAVENOUS | Status: DC | PRN
  Filled 2012-08-09: qty 2

## 2012-08-09 MED ORDER — FENTANYL CITRATE 0.05 MG/ML IJ SOLN
INTRAMUSCULAR | Status: AC
  Administered 2012-08-09: 100 ug via INTRAVENOUS
  Filled 2012-08-09: qty 2

## 2012-08-09 MED ORDER — LANOLIN HYDROUS EX OINT: TOPICAL_OINTMENT | CUTANEOUS | Status: DC | PRN

## 2012-08-09 MED ORDER — IBUPROFEN 600 MG PO TABS
600.0000 mg | ORAL_TABLET | Freq: Four times a day (QID) | ORAL | Status: DC
  Administered 2012-08-09 – 2012-08-10 (×5): 600 mg via ORAL
  Filled 2012-08-09 (×5): qty 1

## 2012-08-09 MED ORDER — BISACODYL 10 MG RE SUPP: 10.0000 mg | Freq: Every day | RECTAL | Status: DC | PRN

## 2012-08-09 MED ORDER — SODIUM CHLORIDE 0.9 % IV SOLN: 250.0000 mL | INTRAVENOUS | Status: DC | PRN

## 2012-08-09 MED ORDER — FLEET ENEMA 7-19 GM/118ML RE ENEM: 1.0000 | ENEMA | Freq: Every day | RECTAL | Status: DC | PRN

## 2012-08-09 MED ORDER — OXYCODONE-ACETAMINOPHEN 5-325 MG PO TABS
1.0000 | ORAL_TABLET | ORAL | Status: DC | PRN
  Administered 2012-08-09 (×2): 2 via ORAL
  Filled 2012-08-09 (×2): qty 2

## 2012-08-09 MED ORDER — HYDROCHLOROTHIAZIDE 25 MG PO TABS
25.0000 mg | ORAL_TABLET | Freq: Every day | ORAL | Status: DC
  Administered 2012-08-09 – 2012-08-10 (×2): 25 mg via ORAL
  Filled 2012-08-09 (×2): qty 1

## 2012-08-09 MED ORDER — OXYTOCIN 40 UNITS IN LACTATED RINGERS INFUSION - SIMPLE MED: 62.5000 mL/h | INTRAVENOUS | Status: DC | PRN

## 2012-08-09 MED ORDER — SODIUM CHLORIDE 0.9 % IJ SOLN: 3.0000 mL | INTRAMUSCULAR | Status: DC | PRN

## 2012-08-09 MED ORDER — ZOLPIDEM TARTRATE 5 MG PO TABS: 5.0000 mg | ORAL_TABLET | Freq: Every evening | ORAL | Status: DC | PRN

## 2012-08-09 MED ORDER — MEASLES, MUMPS & RUBELLA VAC ~~LOC~~ INJ
0.5000 mL | INJECTION | Freq: Once | SUBCUTANEOUS | Status: DC
  Filled 2012-08-09: qty 0.5

## 2012-08-09 MED ORDER — ONDANSETRON HCL 4 MG PO TABS: 4.0000 mg | ORAL_TABLET | ORAL | Status: DC | PRN

## 2012-08-09 MED ORDER — WITCH HAZEL-GLYCERIN EX PADS
1.0000 "application " | MEDICATED_PAD | CUTANEOUS | Status: DC | PRN
  Administered 2012-08-09: 1 via TOPICAL

## 2012-08-09 MED ORDER — SENNOSIDES-DOCUSATE SODIUM 8.6-50 MG PO TABS
2.0000 | ORAL_TABLET | Freq: Every day | ORAL | Status: DC
  Administered 2012-08-09: 2 via ORAL

## 2012-08-09 MED ORDER — DIPHENHYDRAMINE HCL 25 MG PO CAPS: 25.0000 mg | ORAL_CAPSULE | Freq: Four times a day (QID) | ORAL | Status: DC | PRN

## 2012-08-09 NOTE — Consult Note (Signed)
Neonatology Note:  Attendance at Delivery:  I was asked by Dr. Constant to attend this vacuum-assisted vaginal delivery at term due to 16-17 minutes of fetal bradycardia (FHR varying between 50-80). The mother is a G4P2A1 O pos, GBS neg with chronic HTN and GDM, on Glyburide. ROM 3 hours prior to delivery, fluid clear. Infant with minimal tone, blue, and apneic at birth, but HR > 100. After bulb suctioning, she cried on her own and did not require PPV. We gave BBO2 and placed a pulse oximeter; her color improved and we withdrew the BBO2 at 2-3 minutes. Her O2 saturations were normal in room air. Muscle tone and movement improved, also, but were still below normal at 5 minutes. Ap 5/8. Lungs clear to ausc in DR. I spoke with her parents and felt she could remain in the LDR for skin to skin time. To CN to care of Pediatrician.  Emaya Preston C. Ferdie Bakken, MD  

## 2012-08-09 NOTE — Progress Notes (Signed)
Brittany Grant is a 42 y.o. [id number] at [redacted]w[redacted]d by LMP admitted for induction of labor due to Gestational diabetes.  Subjective:   Objective: BP 143/92  Pulse 64  Temp(Src) 97.9 F (36.6 C) (Oral)  Resp 20  Ht 5' (1.524 m)  Wt 94.348 kg (208 lb)  BMI 40.62 kg/m2  LMP 11/09/2011      FHT:  FHR: 130 bpm, variability: moderate,  accelerations:  Present,  decelerations:  Absent UC:   regular SVE:   Dilation: 1 Effacement (%): 50 Station: Ballotable Exam by:: [employer] RN  Labs: Lab Results  Component Value Date   WBC 8.6 08/08/2012   HGB 10.9* 08/08/2012   HCT 32.6* 08/08/2012   MCV 68.6* 08/08/2012   PLT 181 08/08/2012    Assessment / Plan: Induction of labor due to gestational diabetes, received second dose Cytotec 50 mcg  Labor: Progressing normally Preeclampsia:  no signs or symptoms of toxicity, intake and ouput balanced and labs stable Fetal Wellbeing:  Category I Pain Control:  Labor support without medications I/D:  n/a Anticipated MOD:  NSVD  Brittany Grant 08/09/2012, 7:51 AM

## 2012-08-09 NOTE — Progress Notes (Signed)
Assisted  Back to bed after voiding.   Given 100cc apple juice d/t pt. On clear liquids.  Bascom Levels, RN

## 2012-08-09 NOTE — Progress Notes (Addendum)
Brittany Grant is a 42 y.o. [id number] at [redacted]w[redacted]d admitted for induction of labor due to A2DM, CHTN. Pt just returned from br where she felt a pop and felt something coming out  Subjective: Uncomfortable w/ uc's, breathing well, FOB supportive at bs. Desires IV pain meds, not planning epidural  Objective: BP 131/96  Pulse 73  Temp(Src) 97.5 F (36.4 C) (Oral)  Resp 20  Ht 5' (1.524 m)  Wt 94.348 kg (208 lb)  BMI 40.62 kg/m2  LMP 11/09/2011     CBG 86 upon admission  FHT:  FHR: 120 bpm, variability: moderate,  accelerations:  Present,  decelerations:  Absent UC:   regular, every 1-4 minutes SVE:   Dilation: 4 Effacement (%): 90 Station: -2 Exam by:: Brittany Grant CNM SROM clear fluid Labs: Lab Results  Component Value Date   WBC 8.6 08/08/2012   HGB 10.9* 08/08/2012   HCT 32.6* 08/08/2012   MCV 68.6* 08/08/2012   PLT 181 08/08/2012    Assessment / Plan: IOL d/t A2DM, CHTN, s/p 2 cytotec- last at 0230, laboring w/ srom  Labor: Progressing normally Preeclampsia:  no s/s Fetal Wellbeing:  Category I Pain Control:  desires iv pain meds, not planning epidural I/D:  n/a Anticipated MOD:  NSVD CBGs q 2hr  Marge Duncans 08/09/2012, 8:45 AM

## 2012-08-09 NOTE — Progress Notes (Signed)
I have participated in the care of this patient and I agree with the above. Cam Hai 8:49 AM 08/09/2012

## 2012-08-09 NOTE — Progress Notes (Addendum)
Assisted pt. To bed.  HOB elevated 20 degrees.  Instructed not to get OOB without calling for nurse assistance first.  S. O. At bedside.  C/o pain 6/10.  Has received 2 percocet tabs and 600 motrin prior to transfer.

## 2012-08-09 NOTE — Progress Notes (Addendum)
Pushing with contractions.  FHR prolonged decels.  Repositioned pt. To promote recovery.  IV infiltrated.  Cleone Slim replaced IV.  LR infusing well at this time.

## 2012-08-09 NOTE — Progress Notes (Signed)
Additional RNs in room to assist.

## 2012-08-09 NOTE — Progress Notes (Addendum)
Sitting in w/c after  BR.  Voided qs x 1 in BR.  Bloody urine.  No odor.  Ice pak to perienum. Eating crackers c/o Nausea without vomiting.  Attempted to flush hep lock x 1 but pt. C/o pain at site.

## 2012-08-09 NOTE — Progress Notes (Signed)
Labetolol 200mg . Given x 1 p.o.

## 2012-08-09 NOTE — Progress Notes (Addendum)
Dr. Jolayne Panther in room.  Instructing pt. In pushing and breathing with contractions.

## 2012-08-09 NOTE — Progress Notes (Signed)
Pushing with ctx.  Vacuum intact.  Dr. Jolayne Panther working to deliver infant.

## 2012-08-09 NOTE — Progress Notes (Signed)
Brittany Grant here.  Bed raised to highest position.  Pt. Pushing with contractions.Marland Kitchen

## 2012-08-09 NOTE — Progress Notes (Signed)
Attempted to saline lock iv .

## 2012-08-09 NOTE — Progress Notes (Signed)
Prolonged decels the patient. Turned to right side.  .  LR fluid bolus given. Nurses called to assist.

## 2012-08-10 LAB — GLUCOSE, CAPILLARY: Glucose-Capillary: 105 mg/dL — ABNORMAL HIGH (ref 70–99)

## 2012-08-10 MED ORDER — HYDROCHLOROTHIAZIDE 25 MG PO TABS: 25.0000 mg | ORAL_TABLET | Freq: Every day | ORAL | Status: DC

## 2012-08-10 MED ORDER — IBUPROFEN 600 MG PO TABS: 600.0000 mg | ORAL_TABLET | Freq: Four times a day (QID) | ORAL | Status: DC | PRN

## 2012-08-10 NOTE — Progress Notes (Signed)
Pt called for blood sugar, after blood sugar taken and was informed it was 133 she stated that she had eaten ice cream in the 2 hour time period.

## 2012-08-10 NOTE — Discharge Summary (Signed)
Obstetric Discharge Summary Reason for Admission: induction of labor d/t A2DM and CHTN Prenatal Procedures: NST and ultrasound Intrapartum Procedures: VAVD d/t fetal bradycardia Postpartum Procedures: none Complications-Operative and Postpartum: none Eating, drinking, voiding, ambulating well.  +flatus.  Lochia and pain wnl.  Denies dizziness, lightheadedness, or sob. No complaints.  Desires early d/c  Hemoglobin  Date Value Range Status  08/09/2012 11.3* 12.0 - 15.0 g/dL Final     HCT  Date Value Range Status  08/09/2012 34.3* 36.0 - 46.0 % Final  BP 136/93  Pulse 73  Temp(Src) 97.3 F (36.3 C) (Oral)  Resp 18  Ht 5' (1.524 m)  Wt 94.348 kg (208 lb)  BMI 40.62 kg/m2  SpO2 92%  LMP 11/09/2011  CBGs: 2hr pp 118, 133-pt admits to having ate ice cream during 2hr pp time frame, Fasting 105  Physical Exam:  General: alert, cooperative and no distress Lochia: appropriate Uterine Fundus: firm Incision: n/a DVT Evaluation: No evidence of DVT seen on physical exam. Negative Homan's sign. No cords or calf tenderness. No significant calf/ankle edema.  Discharge Diagnoses: Term Pregnancy-delivered  Discharge Information: Date: 08/10/2012 Activity: pelvic rest Diet: routine Medications: PNV and Ibuprofen, HCTZ 25mg  daily Condition: stable Instructions: refer to practice specific booklet Discharge to: home Follow-up Information   Follow up with [facility] In 6 weeks. (for postpartum visit)    Contact information:   [address] [place] [address] [phone]      Newborn Data: Live born female  Birth Weight: 7 lb 0.5 oz (3189 g) APGAR: 5, 8  Home with mother pending d/c from peds. Bottlefeeding, Mirena for contraception  Marge Duncans 08/10/2012, 8:41 AM

## 2012-08-11 NOTE — Discharge Summary (Signed)
Attestation of Attending Supervision of Advanced Practitioner (CNM/NP): Evaluation and management procedures were performed by the Advanced Practitioner under my supervision and collaboration.  I have reviewed the Advanced Practitioner's note and chart, and I agree with the management and plan.  Lafreda Casebeer 08/11/2012 10:58 AM   

## 2012-08-13 NOTE — H&P (Signed)
Attestation of Attending Supervision of Advanced Practitioner: Evaluation and management procedures were performed by the PA/NP/CNM/OB Fellow under my supervision/collaboration. Chart reviewed and agree with management and plan.  Danielly Ackerley V 08/13/2012 7:03 AM    

## 2012-09-03 ENCOUNTER — Encounter: Payer: Self-pay | Admitting: Family Medicine

## 2012-09-03 ENCOUNTER — Ambulatory Visit: Payer: BC Managed Care – PPO | Admitting: Family Medicine

## 2012-09-24 ENCOUNTER — Ambulatory Visit (INDEPENDENT_AMBULATORY_CARE_PROVIDER_SITE_OTHER): Payer: BC Managed Care – PPO | Admitting: Family Medicine

## 2012-09-24 ENCOUNTER — Encounter: Payer: Self-pay | Admitting: Family Medicine

## 2012-09-24 VITALS — BP 148/117 | HR 58 | Ht 60.0 in | Wt 185.0 lb

## 2012-09-24 DIAGNOSIS — I10 Essential (primary) hypertension: Secondary | ICD-10-CM

## 2012-09-24 DIAGNOSIS — Z30430 Encounter for insertion of intrauterine contraceptive device: Secondary | ICD-10-CM

## 2012-09-24 DIAGNOSIS — Z01812 Encounter for preprocedural laboratory examination: Secondary | ICD-10-CM

## 2012-09-24 LAB — POCT URINE PREGNANCY: Preg Test, Ur: NEGATIVE

## 2012-09-24 MED ORDER — LISINOPRIL 10 MG PO TABS: 20.0000 mg | ORAL_TABLET | Freq: Every day | ORAL | Status: DC

## 2012-09-24 MED ORDER — LEVONORGESTREL 20 MCG/24HR IU IUD: 1.0000 | INTRAUTERINE_SYSTEM | Freq: Once | INTRAUTERINE | Status: DC

## 2012-09-24 NOTE — Patient Instructions (Signed)
Levonorgestrel intrauterine device (IUD) What is this medicine? LEVONORGESTREL IUD (LEE voe nor jes trel) is a contraceptive (birth control) device. The device is placed inside the uterus by a healthcare professional. It is used to prevent pregnancy and can also be used to treat heavy bleeding that occurs during your period. Depending on the device, it can be used for 3 to 5 years. This medicine may be used for other purposes; ask your health care provider or pharmacist if you have questions. What should I tell my health care provider before I take this medicine? They need to know if you have any of these conditions: -abnormal Pap smear -cancer of the breast, uterus, or cervix -diabetes -endometritis -genital or pelvic infection now or in the past -have more than one sexual partner or your partner has more than one partner -heart disease -history of an ectopic or tubal pregnancy -immune system problems -IUD in place -liver disease or tumor -problems with blood clots or take blood-thinners -use intravenous drugs -uterus of unusual shape -vaginal bleeding that has not been explained -an unusual or allergic reaction to levonorgestrel, other hormones, silicone, or polyethylene, medicines, foods, dyes, or preservatives -pregnant or trying to get pregnant -breast-feeding How should I use this medicine? This device is placed inside the uterus by a health care professional. Talk to your pediatrician regarding the use of this medicine in children. Special care may be needed. Overdosage: If you think you have taken too much of this medicine contact a poison control center or emergency room at once. NOTE: This medicine is only for you. Do not share this medicine with others. What if I miss a dose? This does not apply. What may interact with this medicine? Do not take this medicine with any of the following medications: -amprenavir -bosentan -fosamprenavir This medicine may also interact with  the following medications: -aprepitant -barbiturate medicines for inducing sleep or treating seizures -bexarotene -griseofulvin -medicines to treat seizures like carbamazepine, ethotoin, felbamate, oxcarbazepine, phenytoin, topiramate -modafinil -pioglitazone -rifabutin -rifampin -rifapentine -some medicines to treat HIV infection like atazanavir, indinavir, lopinavir, nelfinavir, tipranavir, ritonavir -St. John's wort -warfarin This list may not describe all possible interactions. Give your health care provider a list of all the medicines, herbs, non-prescription drugs, or dietary supplements you use. Also tell them if you smoke, drink alcohol, or use illegal drugs. Some items may interact with your medicine. What should I watch for while using this medicine? Visit your doctor or health care professional for regular check ups. See your doctor if you or your partner has sexual contact with others, becomes HIV positive, or gets a sexual transmitted disease. This product does not protect you against HIV infection (AIDS) or other sexually transmitted diseases. You can check the placement of the IUD yourself by reaching up to the top of your vagina with clean fingers to feel the threads. Do not pull on the threads. It is a good habit to check placement after each menstrual period. Call your doctor right away if you feel more of the IUD than just the threads or if you cannot feel the threads at all. The IUD may come out by itself. You may become pregnant if the device comes out. If you notice that the IUD has come out use a backup birth control method like condoms and call your health care provider. Using tampons will not change the position of the IUD and are okay to use during your period. What side effects may I notice from receiving this medicine?   Side effects that you should report to your doctor or health care professional as soon as possible: -allergic reactions like skin rash, itching or  hives, swelling of the face, lips, or tongue -fever, flu-like symptoms -genital sores -high blood pressure -no menstrual period for 6 weeks during use -pain, swelling, warmth in the leg -pelvic pain or tenderness -severe or sudden headache -signs of pregnancy -stomach cramping -sudden shortness of breath -trouble with balance, talking, or walking -unusual vaginal bleeding, discharge -yellowing of the eyes or skin Side effects that usually do not require medical attention (report to your doctor or health care professional if they continue or are bothersome): -acne -breast pain -change in sex drive or performance -changes in weight -cramping, dizziness, or faintness while the device is being inserted -headache -irregular menstrual bleeding within first 3 to 6 months of use -nausea This list may not describe all possible side effects. Call your doctor for medical advice about side effects. You may report side effects to FDA at 1-800-FDA-1088. Where should I keep my medicine? This does not apply. NOTE: This sheet is a summary. It may not cover all possible information. If you have questions about this medicine, talk to your doctor, pharmacist, or health care provider.  2013, Elsevier/Gold Standard. (03/15/2011 1:54:04 PM) Hypertension As your heart beats, it forces blood through your arteries. This force is your blood pressure. If the pressure is too high, it is called hypertension (HTN) or high blood pressure. HTN is dangerous because you may have it and not know it. High blood pressure may mean that your heart has to work harder to pump blood. Your arteries may be narrow or stiff. The extra work puts you at risk for heart disease, stroke, and other problems.  Blood pressure consists of two numbers, a higher number over a lower, 110/72, for example. It is stated as "110 over 72." The ideal is below 120 for the top number (systolic) and under 80 for the bottom (diastolic). Write down your  blood pressure today. You should pay close attention to your blood pressure if you have certain conditions such as:  Heart failure.  Prior heart attack.  Diabetes  Chronic kidney disease.  Prior stroke.  Multiple risk factors for heart disease. To see if you have HTN, your blood pressure should be measured while you are seated with your arm held at the level of the heart. It should be measured at least twice. A one-time elevated blood pressure reading (especially in the Emergency Department) does not mean that you need treatment. There may be conditions in which the blood pressure is different between your right and left arms. It is important to see your caregiver soon for a recheck. Most people have essential hypertension which means that there is not a specific cause. This type of high blood pressure may be lowered by changing lifestyle factors such as:  Stress.  Smoking.  Lack of exercise.  Excessive weight.  Drug/tobacco/alcohol use.  Eating less salt. Most people do not have symptoms from high blood pressure until it has caused damage to the body. Effective treatment can often prevent, delay or reduce that damage. TREATMENT  When a cause has been identified, treatment for high blood pressure is directed at the cause. There are a large number of medications to treat HTN. These fall into several categories, and your caregiver will help you select the medicines that are best for you. Medications may have side effects. You should review side effects with your caregiver. If  your blood pressure stays high after you have made lifestyle changes or started on medicines,   Your medication(s) may need to be changed.  Other problems may need to be addressed.  Be certain you understand your prescriptions, and know how and when to take your medicine.  Be sure to follow up with your caregiver within the time frame advised (usually within two weeks) to have your blood pressure rechecked  and to review your medications.  If you are taking more than one medicine to lower your blood pressure, make sure you know how and at what times they should be taken. Taking two medicines at the same time can result in blood pressure that is too low. SEEK IMMEDIATE MEDICAL CARE IF:  You develop a severe headache, blurred or changing vision, or confusion.  You have unusual weakness or numbness, or a faint feeling.  You have severe chest or abdominal pain, vomiting, or breathing problems. MAKE SURE YOU:   Understand these instructions.  Will watch your condition.  Will get help right away if you are not doing well or get worse. Document Released: 02/12/2005 Document Revised: 05/07/2011 Document Reviewed: 10/03/2007 [facility] Patient Information 2014 [place], [place].

## 2012-09-24 NOTE — Assessment & Plan Note (Signed)
Markedly elevated--risk of stroke noted.  Will add Lisinopril.  Pt. Is not nursing.

## 2012-09-24 NOTE — Progress Notes (Signed)
  Subjective:    Patient ID: Brittany Grant, female    DOB: 1970-06-10, 42 y.o.   MRN: [id number]  HPI  C/o headache.  Under a lot of stress.  She is desiring IUD insertion.  Taking HCTZ daily.  BP up last 2 visits.  Review of Systems  Constitutional: Negative for fever.  Respiratory: Negative for chest tightness and shortness of breath.   Cardiovascular: Negative for chest pain.  Gastrointestinal: Negative for abdominal pain.  Genitourinary: Negative for menstrual problem.       Objective:   Physical Exam  Vitals reviewed. Constitutional: She is oriented to person, place, and time. She appears well-developed and well-nourished. No distress.  HENT:  Head: Normocephalic and atraumatic.  Cardiovascular: Normal rate.   Pulmonary/Chest: Effort normal.  Abdominal: Soft. There is no tenderness.  Genitourinary: Vagina normal and uterus normal.  Musculoskeletal: She exhibits no edema.  Neurological: She is alert and oriented to person, place, and time.   Procedure: Patient identified, informed consent performed, signed copy in chart, time out was performed.  Urine pregnancy test negative.  Speculum placed in the vagina.  Cervix visualized.  Cleaned with Betadine x 2.  Grasped anteriourly with a single tooth tenaculum.  Uterus sounded to 7 cm.  Mirena IUD placed per manufacturer's recommendations.  Strings trimmed to 3 cm.   Patient given post procedure instructions and Mirena care card with expiration date.  Patient is asked to check IUD strings periodically and follow up in 4-6 weeks for IUD check if needed..         Assessment & Plan:

## 2012-09-25 ENCOUNTER — Ambulatory Visit: Payer: BC Managed Care – PPO | Admitting: Obstetrics and Gynecology

## 2012-10-22 ENCOUNTER — Ambulatory Visit: Payer: BC Managed Care – PPO | Admitting: Obstetrics & Gynecology

## 2012-10-22 ENCOUNTER — Encounter: Payer: Self-pay | Admitting: *Deleted

## 2012-10-31 ENCOUNTER — Ambulatory Visit: Payer: BC Managed Care – PPO | Admitting: Family Medicine

## 2013-01-09 ENCOUNTER — Encounter: Payer: Self-pay | Admitting: Internal Medicine

## 2013-01-09 ENCOUNTER — Ambulatory Visit (INDEPENDENT_AMBULATORY_CARE_PROVIDER_SITE_OTHER): Payer: BC Managed Care – PPO | Admitting: Internal Medicine

## 2013-01-09 VITALS — BP 140/98 | HR 86 | Temp 97.9°F | Wt 202.0 lb

## 2013-01-09 DIAGNOSIS — I10 Essential (primary) hypertension: Secondary | ICD-10-CM

## 2013-01-09 DIAGNOSIS — E669 Obesity, unspecified: Secondary | ICD-10-CM

## 2013-01-09 DIAGNOSIS — R358 Other polyuria: Secondary | ICD-10-CM

## 2013-01-09 DIAGNOSIS — R3589 Other polyuria: Secondary | ICD-10-CM

## 2013-01-09 LAB — HEMOGLOBIN A1C: Hgb A1c MFr Bld: 5.8 % — ABNORMAL HIGH (ref <5.7)

## 2013-01-09 MED ORDER — LISINOPRIL-HYDROCHLOROTHIAZIDE 20-25 MG PO TABS: 1.0000 | ORAL_TABLET | Freq: Every day | ORAL | Status: DC

## 2013-01-09 NOTE — Progress Notes (Signed)
Pre-visit discussion using our clinic review tool. No additional management support is needed unless otherwise documented below in the visit note.  

## 2013-01-09 NOTE — Patient Instructions (Signed)

## 2013-01-09 NOTE — Progress Notes (Signed)
Subjective:    Patient ID: Brittany Grant, female    DOB: 24-Dec-1970, 42 y.o.   MRN: [id number]  HPI  Pt presents to the clinic today for a BP check. She has had a headache and blurred vision. She does have a history of HTN and is on Lisinopril 20 mg daily. She has been on HCTZ in the past but switched to lisinopril when she became pregnant. She has not had a eye exam. She does have a strong family history of HTN. Of note, she is concerned about possible diabetes. She did have gestational diabetes with her last pregnancy. She does urinated often. She does have a strong family history of diabetes and has multiple family members on dialysis. She would like to be checked for diabetes today.  Review of Systems  Past Medical History  Diagnosis Date  . Hypertension   . GERD (gastroesophageal reflux disease)   . IUD 04/26/2010  . Obesity   . Scarring, keloid   . Hemorrhoid     Current Outpatient Prescriptions  Medication Sig Dispense Refill  . ibuprofen (ADVIL,MOTRIN) 600 MG tablet Take 1 tablet (600 mg total) by mouth every 6 (six) hours as needed for pain.  30 tablet  0  . lisinopril (ZESTRIL) 10 MG tablet Take 2 tablets (20 mg total) by mouth daily.  30 tablet  5   No current facility-administered medications for this visit.    No Known Allergies  Family History  Problem Relation Age of Onset  . Hypertension Other   . Diabetes Mother   . Diabetes Father   . Hypertension Mother   . Hypertension Father   . Hypertension Father     History   Social History  . Marital Status: Single    Spouse Name: N/A    Number of Children: N/A  . Years of Education: N/A   Occupational History  . Energy manager    Social History Main Topics  . Smoking status: Former Smoker -- 10 years    Types: Cigarettes    Quit date: 01/23/2012  . Smokeless tobacco: Never Used     Comment: smoking 1/4 pack per day  . Alcohol Use: No  . Drug Use: No  . Sexual Activity: Yes   Birth Control/ Protection: IUD, None   Other Topics Concern  . Not on file   Social History Narrative  . No narrative on file     Constitutional: Denies fever, malaise, fatigue, headache or abrupt weight changes.  HEENT: Denies eye pain, eye redness, ear pain, ringing in the ears, wax buildup, runny nose, nasal congestion, bloody nose, or sore throat. Respiratory: Denies difficulty breathing, shortness of breath, cough or sputum production.   Cardiovascular: Denies chest pain, chest tightness, palpitations or swelling in the hands or feet.   GU: Denies urgency,  pain with urination, burning sensation, blood in urine, odor or discharge. Neurological: Denies dizziness, difficulty with memory, difficulty with speech or problems with balance and coordination.   No other specific complaints in a complete review of systems (except as listed in HPI above).     Objective:   Physical Exam  BP 140/98  Pulse 86  Temp(Src) 97.9 F (36.6 C) (Tympanic)  Wt 202 lb (91.627 kg)  Breastfeeding? No Wt Readings from Last 3 Encounters:  01/09/13 202 lb (91.627 kg)  09/24/12 185 lb (83.915 kg)  09/03/12 185 lb (83.915 kg)    General: Appears her stated age, overweight but well developed, well nourished in NAD. HEENT:  Head: normal shape and size; Eyes: sclera white, no icterus, conjunctiva pink, PERRLA and EOMs intact; Ears: Tm's gray and intact, normal light reflex; Nose: mucosa pink and moist, septum midline; Throat/Mouth: Teeth present, mucosa pink and moist, no exudate, lesions or ulcerations noted.  Cardiovascular: Normal rate and rhythm. S1,S2 noted.  No murmur, rubs or gallops noted. No JVD or BLE edema. No carotid bruits noted. Pulmonary/Chest: Normal effort and positive vesicular breath sounds. No respiratory distress. No wheezes, rales or ronchi noted.  Neurological: Alert and oriented. Cranial nerves II-XII intact. Coordination normal. +DTRs bilaterally.  BMET    Component Value  Date/Time   NA 133* 07/11/2012 1227   K 3.6 07/11/2012 1227   CL 106 07/11/2012 1227   CO2 19 07/11/2012 1227   GLUCOSE 86 08/08/2012 2030   BUN 6 07/11/2012 1227   CREATININE 0.52 07/11/2012 1227   CREATININE 0.54 03/03/2012 0853   CREATININE 0.62 02/18/2012 1210   CALCIUM 8.8 07/11/2012 1227   GFRNONAA >90 07/11/2012 1227   GFRAA >90 07/11/2012 1227    Lipid Panel  No results found for this basename: chol, trig, hdl, cholhdl, vldl, ldlcalc    CBC    Component Value Date/Time   WBC 8.6 08/09/2012 1000   RBC 4.99 08/09/2012 1000   HGB 11.3* 08/09/2012 1000   HCT 34.3* 08/09/2012 1000   PLT 172 08/09/2012 1000   MCV 68.7* 08/09/2012 1000   MCH 22.6* 08/09/2012 1000   MCHC 32.9 08/09/2012 1000   RDW 15.9* 08/09/2012 1000   LYMPHSABS 1.9 03/03/2012 0853   MONOABS 0.8 03/03/2012 0853   EOSABS 0.2 03/03/2012 0853   BASOSABS 0.0 03/03/2012 0853    Hgb A1C Lab Results  Component Value Date   HGBA1C 6.2* 05/29/2012         Assessment & Plan:   Headache secondary to uncontrolled HTN:  New RX for lisinopril- HCTZ 20-25 daily She will make an appointment to visit an eye doctor She agrees to make changes in her diet- mainly avoiding salt  RTC in 2 weeks for BP check  Obesity and polyuria:  Will check A1C today

## 2013-02-02 ENCOUNTER — Ambulatory Visit: Payer: BC Managed Care – PPO | Admitting: Internal Medicine

## 2013-02-02 DIAGNOSIS — Z0289 Encounter for other administrative examinations: Secondary | ICD-10-CM

## 2013-02-13 ENCOUNTER — Other Ambulatory Visit: Payer: Self-pay | Admitting: Internal Medicine

## 2013-03-21 ENCOUNTER — Other Ambulatory Visit: Payer: Self-pay | Admitting: Internal Medicine

## 2013-03-23 NOTE — Telephone Encounter (Signed)
Ok to refill one time only.  Needs appt for further refills.

## 2013-03-23 NOTE — Telephone Encounter (Signed)
Last filled 02/13/13--but pt no showed her 2 wk f/u after starting this medication new--please advise if it is okay to fill

## 2013-03-23 NOTE — Telephone Encounter (Signed)
Dr. Deborra Medina Pt

## 2013-04-19 ENCOUNTER — Other Ambulatory Visit: Payer: Self-pay | Admitting: Internal Medicine

## 2013-05-11 ENCOUNTER — Ambulatory Visit: Payer: BC Managed Care – PPO | Admitting: Family Medicine

## 2013-05-11 ENCOUNTER — Ambulatory Visit: Payer: BC Managed Care – PPO | Admitting: Internal Medicine

## 2013-06-01 ENCOUNTER — Ambulatory Visit: Payer: No Typology Code available for payment source | Admitting: Family Medicine

## 2013-06-01 DIAGNOSIS — Z0289 Encounter for other administrative examinations: Secondary | ICD-10-CM

## 2013-06-10 ENCOUNTER — Other Ambulatory Visit: Payer: Self-pay | Admitting: Family Medicine

## 2013-06-16 ENCOUNTER — Ambulatory Visit: Payer: Self-pay | Admitting: Family Medicine

## 2013-06-22 ENCOUNTER — Ambulatory Visit: Payer: Self-pay | Admitting: Family Medicine

## 2013-06-22 ENCOUNTER — Encounter: Payer: Self-pay | Admitting: Family Medicine

## 2013-06-22 DIAGNOSIS — Z0289 Encounter for other administrative examinations: Secondary | ICD-10-CM

## 2013-06-23 ENCOUNTER — Telehealth: Payer: Self-pay | Admitting: Family Medicine

## 2013-06-23 NOTE — Telephone Encounter (Signed)
Dismissal Letter sent by Certified Mail 20/11/710  Dismissal Letter returned UnClaimed on 07/23/2013  Dismissal Letter sent by 1st Class Mail 07/24/2013

## 2013-06-23 NOTE — Telephone Encounter (Addendum)
Patient dismissed from [facility] by Arnette Norris MD , effective June 22, 2013. Dismissal letter sent out by certified / registered mail. [facility]  Certified dismissal letter returned as undeliverable, unclaimed, return to sender after three attempts by [place]. Letter placed in another envelope and resent as 1st class mail which does not require a signature. 07/23/13 DAJ

## 2013-07-23 ENCOUNTER — Encounter: Payer: Self-pay | Admitting: Family Medicine

## 2013-08-11 ENCOUNTER — Other Ambulatory Visit: Payer: Self-pay | Admitting: Family Medicine

## 2013-09-02 ENCOUNTER — Telehealth: Payer: Self-pay | Admitting: *Deleted

## 2013-09-02 DIAGNOSIS — I10 Essential (primary) hypertension: Secondary | ICD-10-CM

## 2013-09-02 MED ORDER — LISINOPRIL-HYDROCHLOROTHIAZIDE 20-25 MG PO TABS: ORAL_TABLET | ORAL | Status: DC

## 2013-09-02 NOTE — Telephone Encounter (Signed)
Patient is in need of a refill for blood pressure medication, she has an appointment set up next week with Dr. Kennon Rounds.  She states she has been dismissed from [place] due to she missed a couple of appointments because her daughter has been sick and had to have surgery on her ears, so she has missed a lot of work as well and was not able to make it to her appointments.  I have given her one refill and advised her she needs to keep her appointment next week with Dr. Kennon Rounds.

## 2013-09-11 ENCOUNTER — Encounter: Payer: Self-pay | Admitting: Family Medicine

## 2013-09-11 ENCOUNTER — Ambulatory Visit (INDEPENDENT_AMBULATORY_CARE_PROVIDER_SITE_OTHER): Payer: No Typology Code available for payment source | Admitting: Family Medicine

## 2013-09-11 VITALS — BP 144/107 | HR 87 | Ht 60.0 in | Wt 211.0 lb

## 2013-09-11 DIAGNOSIS — Z1151 Encounter for screening for human papillomavirus (HPV): Secondary | ICD-10-CM

## 2013-09-11 DIAGNOSIS — Z01419 Encounter for gynecological examination (general) (routine) without abnormal findings: Secondary | ICD-10-CM

## 2013-09-11 DIAGNOSIS — Z124 Encounter for screening for malignant neoplasm of cervix: Secondary | ICD-10-CM

## 2013-09-11 NOTE — Patient Instructions (Signed)
Preventive Care for Adults A healthy lifestyle and preventive care can promote health and wellness. Preventive health guidelines for women include the following key practices.  A routine yearly physical is a good way to check with your health care provider about your health and preventive screening. It is a chance to share any concerns and updates on your health and to receive a thorough exam.  Visit your dentist for a routine exam and preventive care every 6 months. Brush your teeth twice a day and floss once a day. Good oral hygiene prevents tooth decay and gum disease.  The frequency of eye exams is based on your age, health, family medical history, use of contact lenses, and other factors. Follow your health care provider's recommendations for frequency of eye exams.  Eat a healthy diet. Foods like vegetables, fruits, whole grains, low-fat dairy products, and lean protein foods contain the nutrients you need without too many calories. Decrease your intake of foods high in solid fats, added sugars, and salt. Eat the right amount of calories for you.Get information about a proper diet from your health care provider, if necessary.  Regular physical exercise is one of the most important things you can do for your health. Most adults should get at least 150 minutes of moderate-intensity exercise (any activity that increases your heart rate and causes you to sweat) each week. In addition, most adults need muscle-strengthening exercises on 2 or more days a week.  Maintain a healthy weight. The body mass index (BMI) is a screening tool to identify possible weight problems. It provides an estimate of body fat based on height and weight. Your health care provider can find your BMI, and can help you achieve or maintain a healthy weight.For adults 20 years and older:  A BMI below 18.5 is considered underweight.  A BMI of 18.5 to 24.9 is normal.  A BMI of 25 to 29.9 is considered overweight.  A BMI of  30 and above is considered obese.  Maintain normal blood lipids and cholesterol levels by exercising and minimizing your intake of saturated fat. Eat a balanced diet with plenty of fruit and vegetables. Blood tests for lipids and cholesterol should begin at age 52 and be repeated every 5 years. If your lipid or cholesterol levels are high, you are over 50, or you are at high risk for heart disease, you may need your cholesterol levels checked more frequently.Ongoing high lipid and cholesterol levels should be treated with medicines if diet and exercise are not working.  If you smoke, find out from your health care provider how to quit. If you do not use tobacco, do not start.  Lung cancer screening is recommended for adults aged 37-80 years who are at high risk for developing lung cancer because of a history of smoking. A yearly low-dose CT scan of the lungs is recommended for people who have at least a 30-pack-year history of smoking and are a current smoker or have quit within the past 15 years. A pack year of smoking is smoking an average of 1 pack of cigarettes a day for 1 year (for example: 1 pack a day for 30 years or 2 packs a day for 15 years). Yearly screening should continue until the smoker has stopped smoking for at least 15 years. Yearly screening should be stopped for people who develop a health problem that would prevent them from having lung cancer treatment.  If you are pregnant, do not drink alcohol. If you are breastfeeding,  be very cautious about drinking alcohol. If you are not pregnant and choose to drink alcohol, do not have more than 1 drink per day. One drink is considered to be 12 ounces (355 mL) of beer, 5 ounces (148 mL) of wine, or 1.5 ounces (44 mL) of liquor.  Avoid use of street drugs. Do not share needles with anyone. Ask for help if you need support or instructions about stopping the use of drugs.  High blood pressure causes heart disease and increases the risk of  stroke. Your blood pressure should be checked at least every 1 to 2 years. Ongoing high blood pressure should be treated with medicines if weight loss and exercise do not work.  If you are 75-52 years old, ask your health care provider if you should take aspirin to prevent strokes.  Diabetes screening involves taking a blood sample to check your fasting blood sugar level. This should be done once every 3 years, after age 15, if you are within normal weight and without risk factors for diabetes. Testing should be considered at a younger age or be carried out more frequently if you are overweight and have at least 1 risk factor for diabetes.  Breast cancer screening is essential preventive care for women. You should practice "breast self-awareness." This means understanding the normal appearance and feel of your breasts and may include breast self-examination. Any changes detected, no matter how small, should be reported to a health care provider. Women in their 58s and 30s should have a clinical breast exam (CBE) by a health care provider as part of a regular health exam every 1 to 3 years. After age 16, women should have a CBE every year. Starting at age 53, women should consider having a mammogram (breast X-ray test) every year. Women who have a family history of breast cancer should talk to their health care provider about genetic screening. Women at a high risk of breast cancer should talk to their health care providers about having an MRI and a mammogram every year.  Breast cancer gene (BRCA)-related cancer risk assessment is recommended for women who have family members with BRCA-related cancers. BRCA-related cancers include breast, ovarian, tubal, and peritoneal cancers. Having family members with these cancers may be associated with an increased risk for harmful changes (mutations) in the breast cancer genes BRCA1 and BRCA2. Results of the assessment will determine the need for genetic counseling and  BRCA1 and BRCA2 testing.  Routine pelvic exams to screen for cancer are no longer recommended for nonpregnant women who are considered low risk for cancer of the pelvic organs (ovaries, uterus, and vagina) and who do not have symptoms. Ask your health care provider if a screening pelvic exam is right for you.  If you have had past treatment for cervical cancer or a condition that could lead to cancer, you need Pap tests and screening for cancer for at least 20 years after your treatment. If Pap tests have been discontinued, your risk factors (such as having a new sexual partner) need to be reassessed to determine if screening should be resumed. Some women have medical problems that increase the chance of getting cervical cancer. In these cases, your health care provider may recommend more frequent screening and Pap tests.  The HPV test is an additional test that may be used for cervical cancer screening. The HPV test looks for the virus that can cause the cell changes on the cervix. The cells collected during the Pap test can be  tested for HPV. The HPV test could be used to screen women aged 47 years and older, and should be used in women of any age who have unclear Pap test results. After the age of 36, women should have HPV testing at the same frequency as a Pap test.  Colorectal cancer can be detected and often prevented. Most routine colorectal cancer screening begins at the age of 38 years and continues through age 58 years. However, your health care provider may recommend screening at an earlier age if you have risk factors for colon cancer. On a yearly basis, your health care provider may provide home test kits to check for hidden blood in the stool. Use of a small camera at the end of a tube, to directly examine the colon (sigmoidoscopy or colonoscopy), can detect the earliest forms of colorectal cancer. Talk to your health care provider about this at age 64, when routine screening begins. Direct  exam of the colon should be repeated every 5-10 years through age 21 years, unless early forms of pre-cancerous polyps or small growths are found.  People who are at an increased risk for hepatitis B should be screened for this virus. You are considered at high risk for hepatitis B if:  You were born in a country where hepatitis B occurs often. Talk with your health care provider about which countries are considered high risk.  Your parents were born in a high-risk country and you have not received a shot to protect against hepatitis B (hepatitis B vaccine).  You have HIV or AIDS.  You use needles to inject street drugs.  You live with, or have sex with, someone who has Hepatitis B.  You get hemodialysis treatment.  You take certain medicines for conditions like cancer, organ transplantation, and autoimmune conditions.  Hepatitis C blood testing is recommended for all people born from [birth year] through 1965 and any individual with known risks for hepatitis C.  Practice safe sex. Use condoms and avoid high-risk sexual practices to reduce the spread of sexually transmitted infections (STIs). STIs include gonorrhea, chlamydia, syphilis, trichomonas, herpes, HPV, and human immunodeficiency virus (HIV). Herpes, HIV, and HPV are viral illnesses that have no cure. They can result in disability, cancer, and death.  You should be screened for sexually transmitted illnesses (STIs) including gonorrhea and chlamydia if:  You are sexually active and are younger than 24 years.  You are older than 24 years and your health care provider tells you that you are at risk for this type of infection.  Your sexual activity has changed since you were last screened and you are at an increased risk for chlamydia or gonorrhea. Ask your health care provider if you are at risk.  If you are at risk of being infected with HIV, it is recommended that you take a prescription medicine daily to prevent HIV infection. This is  called preexposure prophylaxis (PrEP). You are considered at risk if:  You are a heterosexual woman, are sexually active, and are at increased risk for HIV infection.  You take drugs by injection.  You are sexually active with a partner who has HIV.  Talk with your health care provider about whether you are at high risk of being infected with HIV. If you choose to begin PrEP, you should first be tested for HIV. You should then be tested every 3 months for as long as you are taking PrEP.  Osteoporosis is a disease in which the bones lose minerals and strength  with aging. This can result in serious bone fractures or breaks. The risk of osteoporosis can be identified using a bone density scan. Women ages 65 years and over and women at risk for fractures or osteoporosis should discuss screening with their health care providers. Ask your health care provider whether you should take a calcium supplement or vitamin D to reduce the rate of osteoporosis.  Menopause can be associated with physical symptoms and risks. Hormone replacement therapy is available to decrease symptoms and risks. You should talk to your health care provider about whether hormone replacement therapy is right for you.  Use sunscreen. Apply sunscreen liberally and repeatedly throughout the day. You should seek shade when your shadow is shorter than you. Protect yourself by wearing long sleeves, pants, a wide-brimmed hat, and sunglasses year round, whenever you are outdoors.  Once a month, do a whole body skin exam, using a mirror to look at the skin on your back. Tell your health care provider of new moles, moles that have irregular borders, moles that are larger than a pencil eraser, or moles that have changed in shape or color.  Stay current with required vaccines (immunizations).  Influenza vaccine. All adults should be immunized every year.  Tetanus, diphtheria, and acellular pertussis (Td, Tdap) vaccine. Pregnant women should  receive 1 dose of Tdap vaccine during each pregnancy. The dose should be obtained regardless of the length of time since the last dose. Immunization is preferred during the 27th-36th week of gestation. An adult who has not previously received Tdap or who does not know her vaccine status should receive 1 dose of Tdap. This initial dose should be followed by tetanus and diphtheria toxoids (Td) booster doses every 10 years. Adults with an unknown or incomplete history of completing a 3-dose immunization series with Td-containing vaccines should begin or complete a primary immunization series including a Tdap dose. Adults should receive a Td booster every 10 years.  Varicella vaccine. An adult without evidence of immunity to varicella should receive 2 doses or a second dose if she has previously received 1 dose. Pregnant females who do not have evidence of immunity should receive the first dose after pregnancy. This first dose should be obtained before leaving the health care facility. The second dose should be obtained 4-8 weeks after the first dose.  Human papillomavirus (HPV) vaccine. Females aged 13-26 years who have not received the vaccine previously should obtain the 3-dose series. The vaccine is not recommended for use in pregnant females. However, pregnancy testing is not needed before receiving a dose. If a female is found to be pregnant after receiving a dose, no treatment is needed. In that case, the remaining doses should be delayed until after the pregnancy. Immunization is recommended for any person with an immunocompromised condition through the age of 26 years if she did not get any or all doses earlier. During the 3-dose series, the second dose should be obtained 4-8 weeks after the first dose. The third dose should be obtained 24 weeks after the first dose and 16 weeks after the second dose.  Zoster vaccine. One dose is recommended for adults aged 60 years or older unless certain conditions are  present.  Measles, mumps, and rubella (MMR) vaccine. Adults born before 1957 generally are considered immune to measles and mumps. Adults born in 1957 or later should have 1 or more doses of MMR vaccine unless there is a contraindication to the vaccine or there is laboratory evidence of immunity to   each of the three diseases. A routine second dose of MMR vaccine should be obtained at least 28 days after the first dose for students attending postsecondary schools, health care workers, or international travelers. People who received inactivated measles vaccine or an unknown type of measles vaccine during 1963-1967 should receive 2 doses of MMR vaccine. People who received inactivated mumps vaccine or an unknown type of mumps vaccine before 1979 and are at high risk for mumps infection should consider immunization with 2 doses of MMR vaccine. For females of childbearing age, rubella immunity should be determined. If there is no evidence of immunity, females who are not pregnant should be vaccinated. If there is no evidence of immunity, females who are pregnant should delay immunization until after pregnancy. Unvaccinated health care workers born before 1957 who lack laboratory evidence of measles, mumps, or rubella immunity or laboratory confirmation of disease should consider measles and mumps immunization with 2 doses of MMR vaccine or rubella immunization with 1 dose of MMR vaccine.  Pneumococcal 13-valent conjugate (PCV13) vaccine. When indicated, a person who is uncertain of her immunization history and has no record of immunization should receive the PCV13 vaccine. An adult aged 19 years or older who has certain medical conditions and has not been previously immunized should receive 1 dose of PCV13 vaccine. This PCV13 should be followed with a dose of pneumococcal polysaccharide (PPSV23) vaccine. The PPSV23 vaccine dose should be obtained at least 8 weeks after the dose of PCV13 vaccine. An adult aged 19  years or older who has certain medical conditions and previously received 1 or more doses of PPSV23 vaccine should receive 1 dose of PCV13. The PCV13 vaccine dose should be obtained 1 or more years after the last PPSV23 vaccine dose.  Pneumococcal polysaccharide (PPSV23) vaccine. When PCV13 is also indicated, PCV13 should be obtained first. All adults aged 65 years and older should be immunized. An adult younger than age 65 years who has certain medical conditions should be immunized. Any person who resides in a nursing home or long-term care facility should be immunized. An adult smoker should be immunized. People with an immunocompromised condition and certain other conditions should receive both PCV13 and PPSV23 vaccines. People with human immunodeficiency virus (HIV) infection should be immunized as soon as possible after diagnosis. Immunization during chemotherapy or radiation therapy should be avoided. Routine use of PPSV23 vaccine is not recommended for American Indians, [place] Natives, or people younger than 65 years unless there are medical conditions that require PPSV23 vaccine. When indicated, people who have unknown immunization and have no record of immunization should receive PPSV23 vaccine. One-time revaccination 5 years after the first dose of PPSV23 is recommended for people aged 19-64 years who have chronic kidney failure, nephrotic syndrome, asplenia, or immunocompromised conditions. People who received 1-2 doses of PPSV23 before age 65 years should receive another dose of PPSV23 vaccine at age 65 years or later if at least 5 years have passed since the previous dose. Doses of PPSV23 are not needed for people immunized with PPSV23 at or after age 65 years.  Meningococcal vaccine. Adults with asplenia or persistent complement component deficiencies should receive 2 doses of quadrivalent meningococcal conjugate (MenACWY-D) vaccine. The doses should be obtained at least 2 months apart.  Microbiologists working with certain meningococcal bacteria, military recruits, people at risk during an outbreak, and people who travel to or live in countries with a high rate of meningitis should be immunized. A first-year college student up through age   21 years who is living in a residence hall should receive a dose if she did not receive a dose on or after her 16th birthday. Adults who have certain high-risk conditions should receive one or more doses of vaccine.  Hepatitis A vaccine. Adults who wish to be protected from this disease, have certain high-risk conditions, work with hepatitis A-infected animals, work in hepatitis A research labs, or travel to or work in countries with a high rate of hepatitis A should be immunized. Adults who were previously unvaccinated and who anticipate close contact with an international adoptee during the first 60 days after arrival in the [place] from a country with a high rate of hepatitis A should be immunized.  Hepatitis B vaccine. Adults who wish to be protected from this disease, have certain high-risk conditions, may be exposed to blood or other infectious body fluids, are household contacts or sex partners of hepatitis B positive people, are clients or workers in certain care facilities, or travel to or work in countries with a high rate of hepatitis B should be immunized.  Haemophilus influenzae type b (Hib) vaccine. A previously unvaccinated person with asplenia or sickle cell disease or having a scheduled splenectomy should receive 1 dose of Hib vaccine. Regardless of previous immunization, a recipient of a hematopoietic stem cell transplant should receive a 3-dose series 6-12 months after her successful transplant. Hib vaccine is not recommended for adults with HIV infection. Preventive Services / Frequency Ages 43 to 39years  Blood pressure check.** / Every 1 to 2 years.  Lipid and cholesterol check.** / Every 5 years beginning at age  75.  Clinical breast exam.** / Every 3 years for women in their 32s and 74s.  BRCA-related cancer risk assessment.** / For women who have family members with a BRCA-related cancer (breast, ovarian, tubal, or peritoneal cancers).  Pap test.** / Every 2 years from ages 65 through 91. Every 3 years starting at age 34 through age 93 or 72 with a history of 3 consecutive normal Pap tests.  HPV screening.** / Every 3 years from ages 46 through ages 53 to 26 with a history of 3 consecutive normal Pap tests.  Hepatitis C blood test.** / For any individual with known risks for hepatitis C.  Skin self-exam. / Monthly.  Influenza vaccine. / Every year.  Tetanus, diphtheria, and acellular pertussis (Tdap, Td) vaccine.** / Consult your health care provider. Pregnant women should receive 1 dose of Tdap vaccine during each pregnancy. 1 dose of Td every 10 years.  Varicella vaccine.** / Consult your health care provider. Pregnant females who do not have evidence of immunity should receive the first dose after pregnancy.  HPV vaccine. / 3 doses over 6 months, if 70 and younger. The vaccine is not recommended for use in pregnant females. However, pregnancy testing is not needed before receiving a dose.  Measles, mumps, rubella (MMR) vaccine.** / You need at least 1 dose of MMR if you were born in 1957 or later. You may also need a 2nd dose. For females of childbearing age, rubella immunity should be determined. If there is no evidence of immunity, females who are not pregnant should be vaccinated. If there is no evidence of immunity, females who are pregnant should delay immunization until after pregnancy.  Pneumococcal 13-valent conjugate (PCV13) vaccine.** / Consult your health care provider.  Pneumococcal polysaccharide (PPSV23) vaccine.** / 1 to 2 doses if you smoke cigarettes or if you have certain conditions.  Meningococcal vaccine.** /  1 dose if you are age 70 to 51 years and a [place] living in a residence hall, or have one of several medical conditions, you need to get vaccinated against meningococcal disease. You may also need additional booster doses.  Hepatitis A vaccine.** / Consult your health care provider.  Hepatitis B vaccine.** / Consult your health care provider.  Haemophilus influenzae type b (Hib) vaccine.** / Consult your health care provider. Ages 40 to 64years  Blood pressure check.** / Every 1 to 2 years.  Lipid and cholesterol check.** / Every 5 years beginning at age 58 years.  Lung cancer screening. / Every year if you are aged 56-80 years and have a 30-pack-year history of smoking and currently smoke or have quit within the past 15 years. Yearly screening is stopped once you have quit smoking for at least 15 years or develop a health problem that would prevent you from having lung cancer treatment.  Clinical breast exam.** / Every year after age 35 years.  BRCA-related cancer risk assessment.** / For women who have family members with a BRCA-related cancer (breast, ovarian, tubal, or peritoneal cancers).  Mammogram.** / Every year beginning at age 109 years and continuing for as long as you are in good health. Consult with your health care provider.  Pap test.** / Every 3 years starting at age 44 years through age 94 or 70 years with a history of 3 consecutive normal Pap tests.  HPV screening.** / Every 3 years from ages 109 years through ages 50 to 30 years with a history of 3 consecutive normal Pap tests.  Fecal occult blood test (FOBT) of stool. / Every year beginning at age 73 years and continuing until age 59 years. You may not need to do this test if you get a colonoscopy every 10 years.  Flexible sigmoidoscopy or colonoscopy.** / Every 5 years for a flexible sigmoidoscopy or every 10 years for a colonoscopy beginning at age 68 years and continuing until age 12 years.  Hepatitis C blood test.** / For all people born from [birth year] through  1965 and any individual with known risks for hepatitis C.  Skin self-exam. / Monthly.  Influenza vaccine. / Every year.  Tetanus, diphtheria, and acellular pertussis (Tdap/Td) vaccine.** / Consult your health care provider. Pregnant women should receive 1 dose of Tdap vaccine during each pregnancy. 1 dose of Td every 10 years.  Varicella vaccine.** / Consult your health care provider. Pregnant females who do not have evidence of immunity should receive the first dose after pregnancy.  Zoster vaccine.** / 1 dose for adults aged 2 years or older.  Measles, mumps, rubella (MMR) vaccine.** / You need at least 1 dose of MMR if you were born in 1957 or later. You may also need a 2nd dose. For females of childbearing age, rubella immunity should be determined. If there is no evidence of immunity, females who are not pregnant should be vaccinated. If there is no evidence of immunity, females who are pregnant should delay immunization until after pregnancy.  Pneumococcal 13-valent conjugate (PCV13) vaccine.** / Consult your health care provider.  Pneumococcal polysaccharide (PPSV23) vaccine.** / 1 to 2 doses if you smoke cigarettes or if you have certain conditions.  Meningococcal vaccine.** / Consult your health care provider.  Hepatitis A vaccine.** / Consult your health care provider.  Hepatitis B vaccine.** / Consult your health care provider.  Haemophilus influenzae type b (Hib) vaccine.** / Consult your health care provider. Ages 48 years  and over  Blood pressure check.** / Every 1 to 2 years.  Lipid and cholesterol check.** / Every 5 years beginning at age 84 years.  Lung cancer screening. / Every year if you are aged 50-80 years and have a 30-pack-year history of smoking and currently smoke or have quit within the past 15 years. Yearly screening is stopped once you have quit smoking for at least 15 years or develop a health problem that would prevent you from having lung cancer  treatment.  Clinical breast exam.** / Every year after age 24 years.  BRCA-related cancer risk assessment.** / For women who have family members with a BRCA-related cancer (breast, ovarian, tubal, or peritoneal cancers).  Mammogram.** / Every year beginning at age 14 years and continuing for as long as you are in good health. Consult with your health care provider.  Pap test.** / Every 3 years starting at age 17 years through age 31 or 74 years with 3 consecutive normal Pap tests. Testing can be stopped between 65 and 70 years with 3 consecutive normal Pap tests and no abnormal Pap or HPV tests in the past 10 years.  HPV screening.** / Every 3 years from ages 30 years through ages 70 or 28 years with a history of 3 consecutive normal Pap tests. Testing can be stopped between 65 and 70 years with 3 consecutive normal Pap tests and no abnormal Pap or HPV tests in the past 10 years.  Fecal occult blood test (FOBT) of stool. / Every year beginning at age 64 years and continuing until age 92 years. You may not need to do this test if you get a colonoscopy every 10 years.  Flexible sigmoidoscopy or colonoscopy.** / Every 5 years for a flexible sigmoidoscopy or every 10 years for a colonoscopy beginning at age 73 years and continuing until age 39 years.  Hepatitis C blood test.** / For all people born from [birth year] through 1963 and any individual with known risks for hepatitis C.  Osteoporosis screening.** / A one-time screening for women ages 35 years and over and women at risk for fractures or osteoporosis.  Skin self-exam. / Monthly.  Influenza vaccine. / Every year.  Tetanus, diphtheria, and acellular pertussis (Tdap/Td) vaccine.** / 1 dose of Td every 10 years.  Varicella vaccine.** / Consult your health care provider.  Zoster vaccine.** / 1 dose for adults aged 59 years or older.  Pneumococcal 13-valent conjugate (PCV13) vaccine.** / Consult your health care provider.  Pneumococcal  polysaccharide (PPSV23) vaccine.** / 1 dose for all adults aged 8 years and older.  Meningococcal vaccine.** / Consult your health care provider.  Hepatitis A vaccine.** / Consult your health care provider.  Hepatitis B vaccine.** / Consult your health care provider.  Haemophilus influenzae type b (Hib) vaccine.** / Consult your health care provider. ** Family history and personal history of risk and conditions may change your health care provider's recommendations. Document Released: 04/10/2001 Document Revised: 02/17/2013 Document Reviewed: 07/10/2010 [facility] Patient Information 2015 [place], [place]. This information is not intended to replace advice given to you by your health care provider. Make sure you discuss any questions you have with your health care provider.

## 2013-09-11 NOTE — Progress Notes (Signed)
  Subjective:     Brittany Grant is a 43 y.o. female and is here for a comprehensive physical exam. The patient reports no problems. Recently married.  Has IUD and no cycles. Did not take BP medication today.   History   Social History  . Marital Status: Single    Spouse Name: N/A    Number of Children: N/A  . Years of Education: N/A   Occupational History  . Programmer, [employer]    Social History Main Topics  . Smoking status: Former Smoker -- 10 years    Types: Cigarettes    Quit date: 01/23/2012  . Smokeless tobacco: Never Used     Comment: smoking 1/4 pack per day  . Alcohol Use: No  . Drug Use: No  . Sexual Activity: Yes    Partners: Male    Patent examiner Protection: IUD, None   Other Topics Concern  . Not on file   Social History Narrative  . No narrative on file   Health Maintenance  Topic Date Due  . Influenza Vaccine  09/26/2013  . Pap Smear  12/20/2014  . Tetanus/tdap  05/30/2022    The following portions of the patient's history were reviewed and updated as appropriate: allergies, current medications, past family history, past medical history, past social history, past surgical history and problem list.  Review of Systems A comprehensive review of systems was negative except for: Eyes: positive for visual disturbance   Objective:    BP 144/107  Pulse 87  Ht 5' (1.524 m)  Wt 211 lb (95.709 kg)  BMI 41.21 kg/m2  Breastfeeding? No General appearance: alert, cooperative and appears stated age Head: Normocephalic, without obvious abnormality, atraumatic Neck: no adenopathy, supple, symmetrical, trachea midline and thyroid not enlarged, symmetric, no tenderness/mass/nodules Lungs: clear to auscultation bilaterally Breasts: normal appearance, no masses or tenderness, one keloid noted Heart: regular rate and rhythm, S1, S2 normal, no murmur, click, rub or gallop Abdomen: soft, non-tender; bowel sounds normal; no masses,  no  organomegaly Pelvic: cervix normal in appearance, external genitalia normal, no adnexal masses or tenderness, no cervical motion tenderness, uterus normal size, shape, and consistency and vagina normal without discharge Extremities: extremities normal, atraumatic, no cyanosis or edema Pulses: 2+ and symmetric Skin: multiple large abnormal keloids noted Lymph nodes: Cervical, supraclavicular, and axillary nodes normal. Neurologic: Grossly normal    Assessment:    Healthy female exam.      Plan:  To see opthalmology today. Screening for malignant neoplasm of the cervix - Plan: Cytology - PAP  Routine gynecological examination - Plan: Cytology - PAP, MM DIGITAL SCREENING BILATERAL, CBC, Creatinine clearance, urine, 24 hour, TSH, Lipid panel     See After Visit Summary for Counseling Recommendations

## 2013-09-15 LAB — CYTOLOGY - PAP

## 2013-10-01 ENCOUNTER — Other Ambulatory Visit: Payer: Self-pay | Admitting: Family Medicine

## 2013-11-12 ENCOUNTER — Other Ambulatory Visit: Payer: Self-pay | Admitting: Family Medicine

## 2013-12-20 ENCOUNTER — Other Ambulatory Visit: Payer: Self-pay | Admitting: Family Medicine

## 2013-12-28 ENCOUNTER — Encounter: Payer: Self-pay | Admitting: Family Medicine

## 2014-01-21 ENCOUNTER — Other Ambulatory Visit: Payer: Self-pay | Admitting: Family Medicine

## 2014-01-27 ENCOUNTER — Encounter: Payer: Self-pay | Admitting: Physician Assistant

## 2014-01-27 ENCOUNTER — Ambulatory Visit (INDEPENDENT_AMBULATORY_CARE_PROVIDER_SITE_OTHER): Payer: No Typology Code available for payment source | Admitting: Physician Assistant

## 2014-01-27 VITALS — BP 137/102 | HR 85 | Ht 60.0 in | Wt 218.2 lb

## 2014-01-27 DIAGNOSIS — L0293 Carbuncle, unspecified: Secondary | ICD-10-CM

## 2014-01-27 DIAGNOSIS — Z30431 Encounter for routine checking of intrauterine contraceptive device: Secondary | ICD-10-CM

## 2014-01-27 DIAGNOSIS — L02231 Carbuncle of abdominal wall: Secondary | ICD-10-CM

## 2014-01-27 DIAGNOSIS — I10 Essential (primary) hypertension: Secondary | ICD-10-CM

## 2014-01-27 NOTE — Patient Instructions (Signed)
Intrauterine Device Information An intrauterine device (IUD) is inserted into your uterus to prevent pregnancy. There are two types of IUDs available:   Copper IUD--This type of IUD is wrapped in copper wire and is placed inside the uterus. Copper makes the uterus and fallopian tubes produce a fluid that kills sperm. The copper IUD can stay in place for 10 years.  Hormone IUD--This type of IUD contains the hormone progestin (synthetic progesterone). The hormone thickens the cervical mucus and prevents sperm from entering the uterus. It also thins the uterine lining to prevent implantation of a fertilized egg. The hormone can weaken or kill the sperm that get into the uterus. One type of hormone IUD can stay in place for 5 years, and another type can stay in place for 3 years. Your health care provider will make sure you are a good candidate for a contraceptive IUD. Discuss with your health care provider the possible side effects.  ADVANTAGES OF AN INTRAUTERINE DEVICE  IUDs are highly effective, reversible, long acting, and low maintenance.   There are no estrogen-related side effects.   An IUD can be used when breastfeeding.   IUDs are not associated with weight gain.   The copper IUD works immediately after insertion.   The hormone IUD works right away if inserted within 7 days of your period starting. You will need to use a backup method of birth control for 7 days if the hormone IUD is inserted at any other time in your cycle.  The copper IUD does not interfere with your female hormones.   The hormone IUD can make heavy menstrual periods lighter and decrease cramping.   The hormone IUD can be used for 3 or 5 years.   The copper IUD can be used for 10 years. DISADVANTAGES OF AN INTRAUTERINE DEVICE  The hormone IUD can be associated with irregular bleeding patterns.   The copper IUD can make your menstrual flow heavier and more painful.   You may experience cramping and  vaginal bleeding after insertion.  Document Released: 01/17/2004 Document Revised: 10/15/2012 Document Reviewed: 08/03/2012 [employer] Patient Information 2015 [employer]. This information is not intended to replace advice given to you by your health care provider. Make sure you discuss any questions you have with your health care provider.  

## 2014-01-27 NOTE — Progress Notes (Signed)
Patient ID: Brittany Grant, female   DOB: 1970/11/10, 43 y.o.   MRN: [id number] History:  Brittany Grant is a 43 y.o. [id number] who presents to clinic today for IUD check and bump.  Over the last week she has felt different and thought it may be IUD related.  She also has a bump in the right inguinal area that has been bothering her over the last few weeks.  It was bigger but has decreased in size.  She denies fever, nausea, vomiting, vaginal discharge, weakness.  The following portions of the patient's history were reviewed and updated as appropriate: allergies, current medications, past family history, past medical history, past social history, past surgical history and problem list.  Review of Systems:  Pertinent items are noted in HPI.  Objective:  Physical Exam BP 137/102 mmHg  Pulse 85  Ht 5' (1.524 m)  Wt 218 lb 3.2 oz (98.975 kg)  BMI 42.61 kg/m2 GENERAL: Well-developed, well-nourished female in no acute distress.  HEENT: Normocephalic, atraumatic.  NECK: Supple. Normal thyroid.  HEART: Regular rate  ABDOMEN: Soft, nontender, nondistended. No organomegaly. Normal bowel sounds appreciated in all quadrants.  PELVIC: Normal external female genitalia. Vagina is pink and rugated.  Normal discharge. Normal cervix contour. IUD strings visualized. Unable to size uterus due to obese pannus.   No adnexal mass or tenderness. Right inguinal fold with one area of induration - ?boil vs lymph node EXTREMITIES: No cyanosis, clubbing, or edema, 2+ distal pulses.   Labs and Imaging No results found.  Assessment & Plan:  Assessment: IUD in place Inguinal carbuncle Elevated blood pressure  Plans: Warm compresses to affected area.  Wash with Dial antibacterial soap.   Obtain PCP asap for PT eval and management RTC July for annual exam or sooner if problems.   Paticia Stack, PA-C 01/27/2014 1:35 PM

## 2014-03-05 ENCOUNTER — Other Ambulatory Visit: Payer: Self-pay | Admitting: Family Medicine

## 2014-04-18 ENCOUNTER — Other Ambulatory Visit: Payer: Self-pay | Admitting: Family Medicine

## 2014-06-07 ENCOUNTER — Other Ambulatory Visit: Payer: Self-pay | Admitting: Family Medicine

## 2014-07-21 ENCOUNTER — Other Ambulatory Visit: Payer: Self-pay | Admitting: Family Medicine

## 2014-07-27 ENCOUNTER — Ambulatory Visit: Payer: No Typology Code available for payment source | Admitting: Family Medicine

## 2014-07-27 DIAGNOSIS — N644 Mastodynia: Secondary | ICD-10-CM

## 2014-09-19 ENCOUNTER — Other Ambulatory Visit: Payer: Self-pay | Admitting: Family Medicine

## 2014-10-20 ENCOUNTER — Other Ambulatory Visit: Payer: Self-pay | Admitting: Family Medicine

## 2014-11-21 ENCOUNTER — Other Ambulatory Visit: Payer: Self-pay | Admitting: Family Medicine

## 2015-01-03 ENCOUNTER — Ambulatory Visit (INDEPENDENT_AMBULATORY_CARE_PROVIDER_SITE_OTHER): Payer: No Typology Code available for payment source | Admitting: Obstetrics and Gynecology

## 2015-01-03 ENCOUNTER — Encounter: Payer: Self-pay | Admitting: Obstetrics and Gynecology

## 2015-01-03 VITALS — BP 154/108 | HR 108 | Resp 18 | Ht 60.0 in | Wt 213.0 lb

## 2015-01-03 DIAGNOSIS — Z124 Encounter for screening for malignant neoplasm of cervix: Secondary | ICD-10-CM

## 2015-01-03 DIAGNOSIS — Z01419 Encounter for gynecological examination (general) (routine) without abnormal findings: Secondary | ICD-10-CM | POA: Diagnosis not present

## 2015-01-03 DIAGNOSIS — Z1151 Encounter for screening for human papillomavirus (HPV): Secondary | ICD-10-CM | POA: Diagnosis not present

## 2015-01-03 DIAGNOSIS — K5900 Constipation, unspecified: Secondary | ICD-10-CM

## 2015-01-03 DIAGNOSIS — I10 Essential (primary) hypertension: Secondary | ICD-10-CM | POA: Diagnosis not present

## 2015-01-03 DIAGNOSIS — Z30431 Encounter for routine checking of intrauterine contraceptive device: Secondary | ICD-10-CM | POA: Diagnosis not present

## 2015-01-03 MED ORDER — DOCUSATE SODIUM 100 MG PO CAPS: 100.0000 mg | ORAL_CAPSULE | Freq: Two times a day (BID) | ORAL | Status: DC

## 2015-01-03 MED ORDER — MECLIZINE HCL 25 MG PO TABS: 25.0000 mg | ORAL_TABLET | Freq: Three times a day (TID) | ORAL | Status: DC | PRN

## 2015-01-03 NOTE — Progress Notes (Signed)
Pt c/o constipation and severe hemorrhoids that cause rectal bleeding when she trys to have a bowel movement, also c/o increased vertigo for 3 days.

## 2015-01-03 NOTE — Progress Notes (Signed)
BP recheck after exam = 154/108, encouraged pt to call PCP immediately for evaluation of high BP, pt acknowledged.

## 2015-01-03 NOTE — Progress Notes (Signed)
  Subjective:     Brittany Grant is a 44 y.o. female [phone] with BMI 41 who is here for a comprehensive physical exam. The patient reports severe constipation and bleeding from hemorroid. Patient states she has not had a bowel movement in 6 days. Patient is sexually active using IUD for contraception.  Past Medical History  Diagnosis Date  . Hypertension   . GERD (gastroesophageal reflux disease)   . IUD 04/26/2010  . Obesity   . Scarring, keloid   . Hemorrhoid    Past Surgical History  Procedure Laterality Date  . Keloid excision     Family History  Problem Relation Age of Onset  . Hypertension Other   . Diabetes Mother   . Diabetes Father   . Hypertension Mother   . Hypertension Father   . Hypertension Father     Social History   Social History  . Marital Status: Married    Spouse Name: N/A  . Number of Children: N/A  . Years of Education: N/A   Occupational History  . Programmer, [employer]    Social History Main Topics  . Smoking status: Former Smoker -- 10 years    Types: Cigarettes    Quit date: 01/23/2012  . Smokeless tobacco: Never Used     Comment: smoking 1/4 pack per day  . Alcohol Use: No  . Drug Use: No  . Sexual Activity:    Partners: Male    Patent examiner Protection: IUD, None   Other Topics Concern  . Not on file   Social History Narrative   Health Maintenance  Topic Date Due  . INFLUENZA VACCINE  09/27/2014  . PAP SMEAR  09/11/2016  . TETANUS/TDAP  05/30/2022  . HIV Screening  Completed       Review of Systems Pertinent items are noted in HPI.   Objective:  Blood pressure 155/98, pulse 108, resp. rate 18, height 5' (1.524 m), weight 213 lb (96.616 kg).     GENERAL: Well-developed, well-nourished female in no acute distress.  HEENT: Normocephalic, atraumatic. Sclerae anicteric.  NECK: Supple. Normal thyroid.  LUNGS: Clear to auscultation bilaterally.  HEART: Regular rate and rhythm. BREASTS: Symmetric in size. No  palpable masses or lymphadenopathy, skin changes, or nipple drainage. ABDOMEN: Soft, nontender, nondistended. No organomegaly. PELVIC: Normal external female genitalia. Vagina is pink and rugated.  Normal discharge. Normal appearing cervix. IUD strings visualized at the os. Uterus is normal in size. No adnexal mass or tenderness. EXTREMITIES: No cyanosis, clubbing, or edema, 2+ distal pulses.    Assessment:    Healthy female exam.      Plan:    Pap smear collected Screening mammogram ordered Patient advised to perform monthly self breast and vulva exam Patient will be contacted with any abnormal results Advised to increase water and fiber intake Advised dulcolax and Rx colace Patient advised to follow up with PCP for better management of HTN See After Visit Summary for Counseling Recommendations

## 2015-01-04 LAB — CYTOLOGY - PAP

## 2015-01-05 ENCOUNTER — Emergency Department: Payer: No Typology Code available for payment source

## 2015-01-05 ENCOUNTER — Emergency Department
Admission: EM | Admit: 2015-01-05 | Discharge: 2015-01-05 | Disposition: A | Discharge status: Home / Self Care | Payer: No Typology Code available for payment source | Attending: Emergency Medicine | Admitting: Emergency Medicine

## 2015-01-05 ENCOUNTER — Encounter: Payer: Self-pay | Admitting: Emergency Medicine

## 2015-01-05 DIAGNOSIS — Z79899 Other long term (current) drug therapy: Secondary | ICD-10-CM | POA: Diagnosis not present

## 2015-01-05 DIAGNOSIS — K5900 Constipation, unspecified: Secondary | ICD-10-CM | POA: Insufficient documentation

## 2015-01-05 DIAGNOSIS — Z3202 Encounter for pregnancy test, result negative: Secondary | ICD-10-CM | POA: Diagnosis not present

## 2015-01-05 DIAGNOSIS — E119 Type 2 diabetes mellitus without complications: Secondary | ICD-10-CM | POA: Insufficient documentation

## 2015-01-05 DIAGNOSIS — Z87891 Personal history of nicotine dependence: Secondary | ICD-10-CM | POA: Diagnosis not present

## 2015-01-05 DIAGNOSIS — I10 Essential (primary) hypertension: Secondary | ICD-10-CM | POA: Insufficient documentation

## 2015-01-05 DIAGNOSIS — Z7984 Long term (current) use of oral hypoglycemic drugs: Secondary | ICD-10-CM | POA: Diagnosis not present

## 2015-01-05 DIAGNOSIS — R1030 Lower abdominal pain, unspecified: Secondary | ICD-10-CM | POA: Diagnosis present

## 2015-01-05 DIAGNOSIS — K645 Perianal venous thrombosis: Secondary | ICD-10-CM | POA: Insufficient documentation

## 2015-01-05 LAB — POCT PREGNANCY, URINE: PREG TEST UR: NEGATIVE

## 2015-01-05 MED ORDER — MAGNESIUM CITRATE PO SOLN
1.0000 | Freq: Once | ORAL | Status: AC
  Administered 2015-01-05: 1 via ORAL
  Filled 2015-01-05: qty 296

## 2015-01-05 NOTE — ED Notes (Signed)
MD Quale at bedside. 

## 2015-01-05 NOTE — ED Provider Notes (Signed)
[facility] Emergency Department Provider Note REMINDER - THIS NOTE IS NOT A FINAL MEDICAL RECORD UNTIL IT IS SIGNED. UNTIL THEN, THE CONTENT BELOW MAY REFLECT INFORMATION FROM A DOCUMENTATION TEMPLATE, NOT THE ACTUAL PATIENT VISIT. ____________________________________________  Time seen: Approximately 1:14 PM  I have reviewed the triage vital signs and the nursing notes.   HISTORY  Chief Complaint Constipation    HPI Brittany Grant is a 44 y.o. female reports previous history of high blood pressure, diabetes. She been pregnant once previously but is not pregnant today.  Patient tells me that for about the last 6 days she is noticed that she will have some discomfort and cramping in the lower abdomen around the rectum and feels "constipated". She reports her last bowel movement was yesterday and was very small following a Fleet's enema. She saw her primary care doctor that day as well who advised and excised a small hemorrhoid.  She denies any nausea or vomiting. She is not having pain in her stomach but more discomfort around the rectum. She occasionally has a tiny amount of blood in the stool which is been attributed to hemorrhoids previously and has been under care per Dr.  She denies pregnancy today.   Past Medical History  Diagnosis Date  . Hypertension   . GERD (gastroesophageal reflux disease)   . IUD 04/26/2010  . Obesity   . Scarring, keloid   . Hemorrhoid     Patient Active Problem List   Diagnosis Date Noted  . Tobacco smoking complicating pregnancy 82/50/5397  . HTN (hypertension) 05/01/2010  . GERD 05/01/2010    Past Surgical History  Procedure Laterality Date  . Keloid excision      Current Outpatient Rx  Name  Route  Sig  Dispense  Refill  . docusate sodium (COLACE) 100 MG capsule   Oral   Take 1 capsule (100 mg total) by mouth 2 (two) times daily.   30 capsule   2   . escitalopram (LEXAPRO) 10 MG tablet   Oral   Take  10 mg by mouth daily.         Marland Kitchen ibuprofen (ADVIL,MOTRIN) 600 MG tablet   Oral   Take 1 tablet (600 mg total) by mouth every 6 (six) hours as needed for pain.   30 tablet   0   . lisinopril-hydrochlorothiazide (PRINZIDE,ZESTORETIC) 20-25 MG per tablet      TAKE 1 TABLET BY MOUTH EVERY DAY   30 tablet   0   . meclizine (ANTIVERT) 25 MG tablet   Oral   Take 1 tablet (25 mg total) by mouth 3 (three) times daily as needed for dizziness.   30 tablet   2   . metFORMIN (GLUCOPHAGE) 500 MG tablet   Oral   Take by mouth daily with breakfast.           Allergies Review of patient's allergies indicates no known allergies.  Family History  Problem Relation Age of Onset  . Hypertension Other   . Diabetes Mother   . Diabetes Father   . Hypertension Mother   . Hypertension Father   . Hypertension Father     Social History Social History  Substance Use Topics  . Smoking status: Former Smoker -- 10 years    Types: Cigarettes    Quit date: 01/23/2012  . Smokeless tobacco: Never Used     Comment: smoking 1/4 pack per day  . Alcohol Use: No    Review of Systems  Constitutional: No fever/chills Eyes: No visual changes. ENT: No sore throat. Cardiovascular: Denies chest pain. Respiratory: Denies shortness of breath. Gastrointestinal: No abdominal pain Associates cramping and a full feeling around the rectum.  No nausea, no vomiting.  No diarrhea.  No constipation. Genitourinary: Negative for dysuria. Musculoskeletal: Negative for back pain. Skin: Negative for rash. Neurological: Negative for headaches, focal weakness or numbness.  10-point ROS otherwise negative.  ____________________________________________   PHYSICAL EXAM:  VITAL SIGNS: ED Triage Vitals  Enc Vitals Group     BP 01/05/15 1113 138/92 mmHg     Pulse Rate 01/05/15 1113 96     Resp 01/05/15 1113 18     Temp 01/05/15 1113 97.9 F (36.6 C)     Temp Source 01/05/15 1113 Oral     SpO2 01/05/15 1113  98 %     Weight 01/05/15 1113 213 lb (96.616 kg)     Height 01/05/15 1113 5' (1.524 m)     Head Cir --      Peak Flow --      Pain Score 01/05/15 1119 7     Pain Loc --      Pain Edu? --      Excl. in [place]? --    Constitutional: Alert and oriented. Well appearing and in no acute distress. Eyes: Conjunctivae are normal. PERRL. EOMI. Head: Atraumatic. Nose: No congestion/rhinnorhea. Mouth/Throat: Mucous membranes are moist.  Oropharynx non-erythematous. Neck: No stridor.   Cardiovascular: Normal rate, regular rhythm. Grossly normal heart sounds.  Good peripheral circulation. Respiratory: Normal respiratory effort.  No retractions. Lungs CTAB. Gastrointestinal: Soft and nontender. No distention. No abdominal bruits. No CVA tenderness. Rectal: The patient has noted small, surgically drained hemorrhoid perirectally which is tender. The patient had 2% viscous lidocaine placed around the rectum, even after this she was unable tolerate rectal exam due to severe discomfort and pain around the hemorrhoid area. Escorted by nurse Ander Purpura. Musculoskeletal: No lower extremity tenderness nor edema.  No joint effusions. Neurologic:  Normal speech and language. No gross focal neurologic deficits are appreciated. No gait instability. Skin:  Skin is warm, dry and intact. No rash noted. Psychiatric: Mood and affect are normal. Speech and behavior are normal.  ____________________________________________   LABS (all labs ordered are listed, but only abnormal results are displayed)  Labs Reviewed  POC URINE PREG, ED  POCT PREGNANCY, URINE   ____________________________________________  EKG   ____________________________________________  RADIOLOGY  DG Abd 1 View (Final result) Result time: 01/05/15 14:36:53   Final result by Rad Results In Interface (01/05/15 14:36:53)   Narrative:   CLINICAL DATA: Rectal pain. Hemorrhoids.  EXAM: ABDOMEN - 1 VIEW  COMPARISON: None.  FINDINGS: The  bowel gas pattern is normal. No radio-opaque calculi or other significant radiographic abnormality are seen. Radiopaque IUD in good position.  IMPRESSION: Negative.    ____________________________________________   PROCEDURES  Procedure(s) performed: None  Critical Care performed: No  ____________________________________________   INITIAL IMPRESSION / ASSESSMENT AND PLAN / ED COURSE  Pertinent labs & imaging results that were available during my care of the patient were reviewed by me and considered in my medical decision making (see chart for details).    Patient presents for pain around the rectum and difficulty with bowel movements. She had a bowel movement yesterday, but reports she has not been ill at within feels an urge to defecate. She had hemorrhoid lanced yesterday by her primary care doctor. On exam her external hemorrhoid is tender, but does appear to  be surgically drained without evidence of complication erythema or surrounding edema.  She does not have any abdominal pain, she has no signs or symptoms of bowel obstruction. She has been able to move her bowels and continues to eat and drink well. At this point, we will perform a rectal examination to evaluate for further etiology such as rectal impaction. Should no rectal impaction be present, anticipate reattempting enema and likely discharge with magnesium citrate.  ----------------------------------------- 1:51 PM on 01/05/2015 -----------------------------------------  Patient had too much pain around her hemorrhoid site despite placing viscous lidocaine over the area and to perform rectal examination. I will obtain x-ray to evaluate for signs or symptoms of stool impaction. If no impaction, then I would recommend the patient start magnesium citrate dose here in the ER and discharge her home to follow-up with Dr. Jimmie Molly was good return precautions. At this point she is too tender around her hemorrhoid to tolerate  enema.  Discussed with the patient careful return precautions including fever, abdominal pain, nausea or vomiting, severe discomfort, significant bleeding in her stool, or if any other concerns arise to return the Er right away. The patient is agreeable. We'll follow up with her primary care doctor this week.  ----------------------------------------- 2:54 PM on 01/05/2015 -----------------------------------------  Patient in no distress, x-ray reviewed and no obvious fecal impaction or sign of obstruction. She has no pain on abdominal exam. Discussed with the patient, we will plan to discharge her and she'll follow up closely with her doctor. We have advised over-the-counter MiraLAX, magnesium citrate, and senna.  At this point, I suspect the patient likely has constipation secondary to pain from her hemorrhoids. She is not able to tolerate rectal exam, thus we will try oral laxatives, close follow-up and return precautions. Patient and her husband both very agreeable.  ____________________________________________   FINAL CLINICAL IMPRESSION(S) / ED DIAGNOSES  Final diagnoses:  Hemorrhoids, external, thrombosed  Constipation, unspecified constipation type      Delman Kitten, MD 01/05/15 1458

## 2015-01-05 NOTE — ED Notes (Signed)
Pt's home lidocaine cream applied to rectum per MD Quale verbal order. Pt tolerated well.

## 2015-01-05 NOTE — ED Notes (Signed)
Pt given urine cup, will let RN know once able to provide sample.

## 2015-01-05 NOTE — ED Notes (Signed)
Pt presents with constipation for six days now. Has tried otc stool softners and enemas with no relief. Pt also states was seen yesterday by pcp for same, states her doctor lanced a hemorrhoid that may have been blocking her bowels from moving. Pt states she did get a little relief after the hemorrhoid was lanced but not enough. Pt c/o abd bloating and rectal pressure.

## 2015-01-05 NOTE — ED Notes (Signed)
Pt at xray

## 2015-01-05 NOTE — Discharge Instructions (Signed)
Follow-up with your doctor tomorrow. Call today for follow-up.  I recommend that she try over-the-counter: - Miralax - Senna - Magnesium Citrate  SEEK IMMEDIATE MEDICAL CARE IF:   You have bright red blood in your stool.   You are vomiting or can not eat  Your constipation lasts for more than 4 days or gets worse.   You have abdominal or rectal pain.   A fever  Other or new concerns arise  You have unexplained weight loss.  Constipation, Adult Constipation is when a person has fewer than three bowel movements a week, has difficulty having a bowel movement, or has stools that are dry, hard, or larger than normal. As people grow older, constipation is more common. A low-fiber diet, not taking in enough fluids, and taking certain medicines may make constipation worse.  CAUSES   Certain medicines, such as antidepressants, pain medicine, iron supplements, antacids, and water pills.   Certain diseases, such as diabetes, irritable bowel syndrome (IBS), thyroid disease, or depression.   Not drinking enough water.   Not eating enough fiber-rich foods.   Stress or travel.   Lack of physical activity or exercise.   Ignoring the urge to have a bowel movement.   Using laxatives too much.  SIGNS AND SYMPTOMS   Having fewer than three bowel movements a week.   Straining to have a bowel movement.   Having stools that are hard, dry, or larger than normal.   Feeling full or bloated.   Pain in the lower abdomen.   Not feeling relief after having a bowel movement.  DIAGNOSIS  Your health care provider will take a medical history and perform a physical exam. Further testing may be done for severe constipation. Some tests may include:  A barium enema X-ray to examine your rectum, colon, and, sometimes, your small intestine.   A sigmoidoscopy to examine your lower colon.   A colonoscopy to examine your entire colon. TREATMENT  Treatment will depend on the  severity of your constipation and what is causing it. Some dietary treatments include drinking more fluids and eating more fiber-rich foods. Lifestyle treatments may include regular exercise. If these diet and lifestyle recommendations do not help, your health care provider may recommend taking over-the-counter laxative medicines to help you have bowel movements. Prescription medicines may be prescribed if over-the-counter medicines do not work.  HOME CARE INSTRUCTIONS   Eat foods that have a lot of fiber, such as fruits, vegetables, whole grains, and beans.  Limit foods high in fat and processed sugars, such as french fries, hamburgers, cookies, candies, and soda.   A fiber supplement may be added to your diet if you cannot get enough fiber from foods.   Drink enough fluids to keep your urine clear or pale yellow.   Exercise regularly or as directed by your health care provider.   Go to the restroom when you have the urge to go. Do not hold it.   Only take over-the-counter or prescription medicines as directed by your health care provider. Do not take other medicines for constipation without talking to your health care provider first.  [place] IF:   You have bright red blood in your stool.   Your constipation lasts for more than 4 days or gets worse.   You have abdominal or rectal pain.   You have thin, pencil-like stools.   You have unexplained weight loss. MAKE SURE YOU:   Understand these instructions.  Will watch your condition.  Will get help right away if you are not doing well or get worse.   This information is not intended to replace advice given to you by your health care provider. Make sure you discuss any questions you have with your health care provider.   Document Released: 11/11/2003 Document Revised: 03/05/2014 Document Reviewed: 11/24/2012 Elsevier Interactive Patient Education [employer].

## 2015-01-07 ENCOUNTER — Other Ambulatory Visit: Payer: Self-pay | Admitting: Family Medicine

## 2015-01-11 ENCOUNTER — Ambulatory Visit (INDEPENDENT_AMBULATORY_CARE_PROVIDER_SITE_OTHER): Payer: No Typology Code available for payment source | Admitting: Family Medicine

## 2015-01-11 ENCOUNTER — Ambulatory Visit: Payer: No Typology Code available for payment source | Attending: Obstetrics and Gynecology

## 2015-01-11 ENCOUNTER — Encounter: Payer: Self-pay | Admitting: Family Medicine

## 2015-01-11 VITALS — BP 132/96 | HR 92 | Resp 18 | Wt 214.0 lb

## 2015-01-11 DIAGNOSIS — Z1151 Encounter for screening for human papillomavirus (HPV): Secondary | ICD-10-CM

## 2015-01-11 DIAGNOSIS — Z124 Encounter for screening for malignant neoplasm of cervix: Secondary | ICD-10-CM

## 2015-01-11 DIAGNOSIS — Z30431 Encounter for routine checking of intrauterine contraceptive device: Secondary | ICD-10-CM

## 2015-01-11 NOTE — Patient Instructions (Signed)
HPV Test The human papillomavirus (HPV) test is used to look for high-risk types of HPV infection. HPV is a group of about 100 viruses. Many of these viruses cause growths on, in, or around the genitals. Most HPV viruses cause infections that usually go away without treatment. However, HPV types 6, 11, 16, and 18 are considered high-risk types of HPV that can increase your risk of cancer of the cervix or anus if the infection is left untreated. An HPV test identifies the DNA (genetic) strands of the HPV infection, so it is also referred to as the HPV DNA test. Although HPV is found in both males and females, the HPV test is only used to screen for increased cancer risk in females:  With an abnormal Pap test.  After treatment of an abnormal Pap test.  Between the ages of 60 and 82.  After treatment of a high-risk HPV infection. The HPV test may be done at the same time as a pelvic exam and Pap test in females over the age of 34. Both the HPV test and Pap test require a sample of cells from the cervix. PREPARATION FOR TEST  Do not douche or take a bath for 24-48 hours before the test or as directed by your health care provider.  Do not have sex for 24-48 hours before the test or as directed by your health care provider.  You may be asked to reschedule the test if you are menstruating.  You will be asked to urinate before the test. RESULTS It is your responsibility to obtain your test results. Ask the lab or department performing the test when and how you will get your results. Talk with your health care provider if you have any questions about your results. Your result will be negative or positive. Meaning of Negative Test Results A negative HPV test result means that no HPV was found, and it is very likely that you do not have HPV. Meaning of Positive Test Results A positive HPV test result indicates that you have HPV.  If your test result shows the presence of any high-risk HPV strains,  you may have an increased risk of developing cancer of the cervix or anus if the infection is left untreated.  If any low-risk HPV strains are found, you are not likely to have an increased risk of cancer. Discuss your test results with your health care provider. He or she will use the results to make a diagnosis and determine a treatment plan that is right for you.   This information is not intended to replace advice given to you by your health care provider. Make sure you discuss any questions you have with your health care provider.   Document Released: 03/09/2004 Document Revised: 03/05/2014 Document Reviewed: 06/30/2013 Elsevier Interactive Patient Education [employer].

## 2015-01-11 NOTE — Progress Notes (Signed)
    Subjective:    Patient ID: Brittany Grant is a 44 y.o. female presenting with Repeat Pap Smear  on 01/11/2015  HPI: Had pap last week and result could not be interpreted due to scant cells.  Here for f/u.  Review of Systems  Constitutional: Negative for fever and chills.  Respiratory: Negative for shortness of breath.   Cardiovascular: Negative for chest pain.  Gastrointestinal: Positive for constipation. Negative for abdominal pain.  Skin: Negative for rash.      Objective:    BP 132/96 mmHg  Pulse 92  Resp 18  Wt 214 lb (97.07 kg) Physical Exam  Constitutional: She is oriented to person, place, and time. She appears well-developed and well-nourished. No distress.  Eyes: No scleral icterus.  Cardiovascular: Normal rate.   Pulmonary/Chest: Effort normal.  Abdominal: Soft.  Genitourinary:  BUS normal, vagina is pink and rugated, cervix is multiparous without lesion, IUD strings noted   Neurological: She is alert and oriented to person, place, and time.  Skin: Skin is warm and dry.        Assessment & Plan:  Screening for cervical cancer - Plan: Cytology - PAP   Return in about 1 year (around 01/11/2016) for a CPE.  Melicia Esqueda S 01/11/2015 3:09 PM

## 2015-01-13 LAB — CYTOLOGY - PAP

## 2015-02-11 ENCOUNTER — Other Ambulatory Visit: Payer: Self-pay | Admitting: Family Medicine

## 2015-05-06 DIAGNOSIS — E119 Type 2 diabetes mellitus without complications: Secondary | ICD-10-CM | POA: Insufficient documentation

## 2015-05-10 ENCOUNTER — Telehealth: Payer: Self-pay | Admitting: *Deleted

## 2015-05-10 DIAGNOSIS — I10 Essential (primary) hypertension: Secondary | ICD-10-CM

## 2015-05-10 MED ORDER — LISINOPRIL-HYDROCHLOROTHIAZIDE 20-25 MG PO TABS: 1.0000 | ORAL_TABLET | Freq: Every day | ORAL | Status: DC

## 2015-05-10 NOTE — Telephone Encounter (Signed)
Received refill request for Lisinopril/HCTZ from pharmacy, sent refill to pharmacy per Dr Kennon Rounds order.

## 2015-05-16 ENCOUNTER — Telehealth: Payer: Self-pay | Admitting: *Deleted

## 2015-05-16 DIAGNOSIS — I10 Essential (primary) hypertension: Secondary | ICD-10-CM

## 2015-05-16 MED ORDER — LISINOPRIL-HYDROCHLOROTHIAZIDE 20-25 MG PO TABS: 1.0000 | ORAL_TABLET | Freq: Every day | ORAL | Status: DC

## 2015-05-16 NOTE — Telephone Encounter (Signed)
Pt calling about refill for BP medication, refill was sent to [facility], pt requesting rx to be sent to [facility] [place] instead.  Sent rx to corrected pharmacy.

## 2015-06-08 ENCOUNTER — Ambulatory Visit
Admission: RE | Admit: 2015-06-08 | Discharge: 2015-06-08 | Disposition: A | Discharge status: Home / Self Care | Payer: 59 | Source: Ambulatory Visit | Attending: Obstetrics and Gynecology | Admitting: Obstetrics and Gynecology

## 2015-06-08 DIAGNOSIS — Z1231 Encounter for screening mammogram for malignant neoplasm of breast: Secondary | ICD-10-CM | POA: Insufficient documentation

## 2015-06-08 DIAGNOSIS — Z01419 Encounter for gynecological examination (general) (routine) without abnormal findings: Secondary | ICD-10-CM

## 2015-09-06 ENCOUNTER — Ambulatory Visit: Payer: No Typology Code available for payment source | Admitting: Dietician

## 2015-09-12 ENCOUNTER — Encounter: Payer: Self-pay | Admitting: Dietician

## 2015-09-12 NOTE — Progress Notes (Signed)
Have not heard from pt-discharge letter faxed to MD

## 2016-01-18 ENCOUNTER — Ambulatory Visit: Payer: No Typology Code available for payment source | Admitting: Family Medicine

## 2016-07-03 ENCOUNTER — Ambulatory Visit (INDEPENDENT_AMBULATORY_CARE_PROVIDER_SITE_OTHER): Payer: 59 | Admitting: Family Medicine

## 2016-07-03 ENCOUNTER — Encounter: Payer: Self-pay | Admitting: Family Medicine

## 2016-07-03 VITALS — BP 128/90 | HR 67 | Resp 20 | Ht 60.0 in | Wt 215.0 lb

## 2016-07-03 DIAGNOSIS — Z01419 Encounter for gynecological examination (general) (routine) without abnormal findings: Secondary | ICD-10-CM

## 2016-07-03 DIAGNOSIS — Z124 Encounter for screening for malignant neoplasm of cervix: Secondary | ICD-10-CM | POA: Diagnosis not present

## 2016-07-03 DIAGNOSIS — Z113 Encounter for screening for infections with a predominantly sexual mode of transmission: Secondary | ICD-10-CM

## 2016-07-03 DIAGNOSIS — N898 Other specified noninflammatory disorders of vagina: Secondary | ICD-10-CM

## 2016-07-03 DIAGNOSIS — Z1151 Encounter for screening for human papillomavirus (HPV): Secondary | ICD-10-CM

## 2016-07-03 DIAGNOSIS — I10 Essential (primary) hypertension: Secondary | ICD-10-CM

## 2016-07-03 MED ORDER — LISINOPRIL-HYDROCHLOROTHIAZIDE 20-25 MG PO TABS: 1.0000 | ORAL_TABLET | Freq: Every day | ORAL | 11 refills | Status: DC

## 2016-07-03 NOTE — Progress Notes (Signed)
  Subjective:     Brittany Grant is a 46 y.o. female and is here for a comprehensive physical exam. The patient reports problems - weight gain. Also, has vaginal odor since insertion of IUD.  Social History   Social History  . Marital status: Married    Spouse name: N/A  . Number of children: N/A  . Years of education: N/A   Occupational History  . [employer]   Social History Main Topics  . Smoking status: Former Smoker    Years: 10.00    Types: Cigarettes    Quit date: 01/23/2012  . Smokeless tobacco: Never Used     Comment: one a day  . Alcohol use No  . Drug use: No  . Sexual activity: Yes    Partners: Male    Birth control/ protection: IUD   Other Topics Concern  . Not on file   Social History Narrative  . No narrative on file   Health Maintenance  Topic Date Due  . INFLUENZA VACCINE  09/26/2016  . PAP SMEAR  01/10/2018  . TETANUS/TDAP  05/30/2022  . HIV Screening  Completed    The following portions of the patient's history were reviewed and updated as appropriate: allergies, current medications, past family history, past medical history, past social history, past surgical history and problem list.  Review of Systems Pertinent items noted in HPI and remainder of comprehensive ROS otherwise negative.   Objective:    BP 128/90 (BP Location: Left Arm, Patient Position: Sitting, Cuff Size: Large)   Pulse 67   Resp 20   Ht 5' (1.524 m)   Wt 215 lb (97.5 kg)   BMI 41.99 kg/m  General appearance: alert, cooperative, appears stated age and mildly obese Head: Normocephalic, without obvious abnormality, atraumatic Neck: no adenopathy, supple, symmetrical, trachea midline and thyroid not enlarged, symmetric, no tenderness/mass/nodules Lungs: clear to auscultation bilaterally Breasts: normal appearance, no masses or tenderness Heart: regular rate and rhythm, S1, S2 normal, no murmur, click, rub or gallop Abdomen:  soft, non-tender; bowel sounds normal; no masses,  no organomegaly Pelvic: cervix normal in appearance, external genitalia normal, no adnexal masses or tenderness, no cervical motion tenderness, uterus normal size, shape, and consistency, vagina normal without discharge and IUD strings visualized Extremities: extremities normal, atraumatic, no cyanosis or edema Pulses: 2+ and symmetric Skin: Skin color, texture, turgor normal. No rashes or lesions Lymph nodes: Cervical, supraclavicular, and axillary nodes normal. Neurologic: Grossly normal    Assessment:    Healthy female exam.      Plan:      Problem List Items Addressed This Visit    None    Visit Diagnoses    Encounter for gynecological examination without abnormal finding    -  Primary   Chronic hypertension       Relevant Medications   lisinopril-hydrochlorothiazide (PRINZIDE,ZESTORETIC) 20-25 MG tablet   Screening for cervical cancer       Relevant Orders   Cytology - PAP   Vaginal odor       Relevant Orders   Cytology - PAP      See After Visit Summary for Counseling Recommendations

## 2016-07-03 NOTE — Addendum Note (Signed)
Addended by: Donnamae Jude on: 07/03/2016 01:22 PM   Modules accepted: Level of Service

## 2016-07-03 NOTE — Progress Notes (Signed)
Last pap 12/2014 - normal negative HPV Last mammo- 06/10/2015

## 2016-07-03 NOTE — Patient Instructions (Signed)
Preventive Care 18-39 Years, Female Preventive care refers to lifestyle choices and visits with your health care provider that can promote health and wellness. What does preventive care include?  A yearly physical exam. This is also called an annual well check.  Dental exams once or twice a year.  Routine eye exams. Ask your health care provider how often you should have your eyes checked.  Personal lifestyle choices, including:  Daily care of your teeth and gums.  Regular physical activity.  Eating a healthy diet.  Avoiding tobacco and drug use.  Limiting alcohol use.  Practicing safe sex.  Taking vitamin and mineral supplements as recommended by your health care provider. What happens during an annual well check? The services and screenings done by your health care provider during your annual well check will depend on your age, overall health, lifestyle risk factors, and family history of disease. Counseling  Your health care provider may ask you questions about your:  Alcohol use.  Tobacco use.  Drug use.  Emotional well-being.  Home and relationship well-being.  Sexual activity.  Eating habits.  Work and work environment.  Method of birth control.  Menstrual cycle.  Pregnancy history. Screening  You may have the following tests or measurements:  Height, weight, and BMI.  Diabetes screening. This is done by checking your blood sugar (glucose) after you have not eaten for a while (fasting).  Blood pressure.  Lipid and cholesterol levels. These may be checked every 5 years starting at age 20.  Skin check.  Hepatitis C blood test.  Hepatitis B blood test.  Sexually transmitted disease (STD) testing.  BRCA-related cancer screening. This may be done if you have a family history of breast, ovarian, tubal, or peritoneal cancers.  Pelvic exam and Pap test. This may be done every 3 years starting at age 21. Starting at age 30, this may be done every 5  years if you have a Pap test in combination with an HPV test. Discuss your test results, treatment options, and if necessary, the need for more tests with your health care provider. Vaccines  Your health care provider may recommend certain vaccines, such as:  Influenza vaccine. This is recommended every year.  Tetanus, diphtheria, and acellular pertussis (Tdap, Td) vaccine. You may need a Td booster every 10 years.  Varicella vaccine. You may need this if you have not been vaccinated.  HPV vaccine. If you are 26 or younger, you may need three doses over 6 months.  Measles, mumps, and rubella (MMR) vaccine. You may need at least one dose of MMR. You may also need a second dose.  Pneumococcal 13-valent conjugate (PCV13) vaccine. You may need this if you have certain conditions and were not previously vaccinated.  Pneumococcal polysaccharide (PPSV23) vaccine. You may need one or two doses if you smoke cigarettes or if you have certain conditions.  Meningococcal vaccine. One dose is recommended if you are age 19-21 years and a first-year college student living in a residence hall, or if you have one of several medical conditions. You may also need additional booster doses.  Hepatitis A vaccine. You may need this if you have certain conditions or if you travel or work in places where you may be exposed to hepatitis A.  Hepatitis B vaccine. You may need this if you have certain conditions or if you travel or work in places where you may be exposed to hepatitis B.  Haemophilus influenzae type b (Hib) vaccine. You may need this   if you have certain risk factors. Talk to your health care provider about which screenings and vaccines you need and how often you need them. This information is not intended to replace advice given to you by your health care provider. Make sure you discuss any questions you have with your health care provider. Document Released: 04/10/2001 Document Revised: 11/02/2015  Document Reviewed: 12/14/2014 Elsevier Interactive Patient Education  2017 [employer].

## 2016-07-04 LAB — CYTOLOGY - PAP
Bacterial vaginitis: POSITIVE — AB
CHLAMYDIA, DNA PROBE: NEGATIVE
Candida vaginitis: NEGATIVE
Diagnosis: NEGATIVE
HPV: NOT DETECTED
Neisseria Gonorrhea: NEGATIVE
TRICH (WINDOWPATH): NEGATIVE

## 2016-07-05 ENCOUNTER — Telehealth: Payer: Self-pay | Admitting: *Deleted

## 2016-07-05 DIAGNOSIS — N760 Acute vaginitis: Secondary | ICD-10-CM

## 2016-07-05 DIAGNOSIS — B9689 Other specified bacterial agents as the cause of diseases classified elsewhere: Secondary | ICD-10-CM

## 2016-07-05 MED ORDER — METRONIDAZOLE 500 MG PO TABS: 500.0000 mg | ORAL_TABLET | Freq: Two times a day (BID) | ORAL | 0 refills | Status: DC

## 2016-07-05 NOTE — Telephone Encounter (Signed)
-----   Message from Donnamae Jude, MD sent at 07/05/2016  9:52 AM EDT ----- Has BV but normal pap--treat with Flagyl 500 mg bid

## 2016-07-05 NOTE — Telephone Encounter (Signed)
Pt aware of Pap results and meds at pharm.

## 2016-10-08 ENCOUNTER — Telehealth: Payer: Self-pay

## 2016-10-08 DIAGNOSIS — N63 Unspecified lump in unspecified breast: Secondary | ICD-10-CM

## 2016-10-08 NOTE — Telephone Encounter (Signed)
Patient called concerned about lumps in her breast.  Order for mammogram placed.

## 2016-10-15 ENCOUNTER — Other Ambulatory Visit: Payer: Self-pay

## 2016-10-15 DIAGNOSIS — N6459 Other signs and symptoms in breast: Secondary | ICD-10-CM

## 2016-10-15 NOTE — Progress Notes (Signed)
Patient called complaining of abnormal breast exam.  Orders for further testing placed and appointment made.

## 2016-10-24 ENCOUNTER — Ambulatory Visit
Admission: RE | Admit: 2016-10-24 | Discharge: 2016-10-24 | Disposition: A | Discharge status: Home / Self Care | Payer: 59 | Source: Ambulatory Visit | Attending: Family Medicine | Admitting: Family Medicine

## 2016-10-24 DIAGNOSIS — N6459 Other signs and symptoms in breast: Secondary | ICD-10-CM

## 2016-10-24 DIAGNOSIS — L910 Hypertrophic scar: Secondary | ICD-10-CM | POA: Insufficient documentation

## 2016-10-24 DIAGNOSIS — R590 Localized enlarged lymph nodes: Secondary | ICD-10-CM | POA: Insufficient documentation

## 2016-10-25 ENCOUNTER — Other Ambulatory Visit: Payer: Self-pay | Admitting: Family Medicine

## 2016-10-25 DIAGNOSIS — R928 Other abnormal and inconclusive findings on diagnostic imaging of breast: Secondary | ICD-10-CM

## 2016-11-06 ENCOUNTER — Ambulatory Visit
Admission: RE | Admit: 2016-11-06 | Discharge: 2016-11-06 | Disposition: A | Discharge status: Home / Self Care | Payer: 59 | Source: Ambulatory Visit | Attending: Family Medicine | Admitting: Family Medicine

## 2016-11-06 ENCOUNTER — Other Ambulatory Visit: Payer: Self-pay | Admitting: Family Medicine

## 2016-11-06 DIAGNOSIS — R928 Other abnormal and inconclusive findings on diagnostic imaging of breast: Secondary | ICD-10-CM | POA: Insufficient documentation

## 2017-03-04 ENCOUNTER — Telehealth: Payer: Self-pay

## 2017-03-04 DIAGNOSIS — N6459 Other signs and symptoms in breast: Secondary | ICD-10-CM

## 2017-03-04 NOTE — Telephone Encounter (Signed)
Order has been placed for repeat u/s

## 2017-03-22 DIAGNOSIS — F419 Anxiety disorder, unspecified: Secondary | ICD-10-CM | POA: Insufficient documentation

## 2017-05-13 ENCOUNTER — Encounter: Payer: Self-pay | Admitting: Radiology

## 2017-09-17 ENCOUNTER — Other Ambulatory Visit: Payer: Self-pay | Admitting: Family Medicine

## 2017-09-17 DIAGNOSIS — I10 Essential (primary) hypertension: Secondary | ICD-10-CM

## 2017-09-18 DIAGNOSIS — R002 Palpitations: Secondary | ICD-10-CM | POA: Insufficient documentation

## 2017-09-18 DIAGNOSIS — R0789 Other chest pain: Secondary | ICD-10-CM | POA: Insufficient documentation

## 2017-10-14 ENCOUNTER — Ambulatory Visit: Payer: 59 | Admitting: Family Medicine

## 2017-10-14 DIAGNOSIS — Z01419 Encounter for gynecological examination (general) (routine) without abnormal findings: Secondary | ICD-10-CM

## 2017-10-14 NOTE — Progress Notes (Deleted)
Last 07/23/2016- normal

## 2019-01-28 ENCOUNTER — Other Ambulatory Visit: Payer: Self-pay | Admitting: Family Medicine

## 2019-01-28 DIAGNOSIS — I10 Essential (primary) hypertension: Secondary | ICD-10-CM

## 2019-02-03 ENCOUNTER — Encounter: Payer: Self-pay | Admitting: Family Medicine

## 2019-02-03 ENCOUNTER — Other Ambulatory Visit: Payer: Self-pay

## 2019-02-03 ENCOUNTER — Ambulatory Visit (INDEPENDENT_AMBULATORY_CARE_PROVIDER_SITE_OTHER): Payer: BC Managed Care – PPO | Admitting: Family Medicine

## 2019-02-03 VITALS — BP 154/113 | HR 87 | Ht 60.0 in | Wt 210.0 lb

## 2019-02-03 DIAGNOSIS — Z01419 Encounter for gynecological examination (general) (routine) without abnormal findings: Secondary | ICD-10-CM | POA: Diagnosis not present

## 2019-02-03 DIAGNOSIS — Z30433 Encounter for removal and reinsertion of intrauterine contraceptive device: Secondary | ICD-10-CM | POA: Diagnosis not present

## 2019-02-03 DIAGNOSIS — Z124 Encounter for screening for malignant neoplasm of cervix: Secondary | ICD-10-CM | POA: Diagnosis not present

## 2019-02-03 DIAGNOSIS — Z1151 Encounter for screening for human papillomavirus (HPV): Secondary | ICD-10-CM | POA: Diagnosis not present

## 2019-02-03 MED ORDER — LEVONORGESTREL 20 MCG/24HR IU IUD
INTRAUTERINE_SYSTEM | Freq: Once | INTRAUTERINE | Status: AC
  Administered 2019-02-03: 16:00:00 via INTRAUTERINE

## 2019-02-03 NOTE — Progress Notes (Signed)
  Subjective:     Brittany Grant is a 48 y.o. female and is here for a comprehensive physical exam. The patient reports problems - needs IUD changed out.   The following portions of the patient's history were reviewed and updated as appropriate: allergies, current medications, past family history, past medical history, past social history, past surgical history and problem list.  Review of Systems Pertinent items noted in HPI and remainder of comprehensive ROS otherwise negative.   Objective:    BP (!) 154/113   Pulse 87   Ht 5' (1.524 m)   Wt 210 lb (95.3 kg)   BMI 41.01 kg/m  General appearance: alert, cooperative and appears stated age Head: Normocephalic, without obvious abnormality, atraumatic Neck: no adenopathy, supple, symmetrical, trachea midline and thyroid not enlarged, symmetric, no tenderness/mass/nodules Lungs: clear to auscultation bilaterally Breasts: normal appearance, no masses or tenderness Heart: regular rate and rhythm, S1, S2 normal, no murmur, click, rub or gallop Abdomen: soft, non-tender; bowel sounds normal; no masses,  no organomegaly Pelvic: cervix normal in appearance, external genitalia normal, no adnexal masses or tenderness, no cervical motion tenderness, uterus normal size, shape, and consistency, vagina normal without discharge and IUD strings noted Extremities: Homans sign is negative, no sign of DVT Pulses: 2+ and symmetric Skin: Skin color, texture, turgor normal. No rashes or lesions Lymph nodes: Cervical, supraclavicular, and axillary nodes normal. Neurologic: Grossly normal     Procedure: Speculum placed inside vagina.  Cervix visualized.  Strings grasped with ring forceps.  IUD removed intact.  Procedure: Cervix visualized.  Cleaned with Betadine x 2.  Grasped anteriourly with a single tooth tenaculum.  Uterus sounded to 8 cm.  Mirena IUD placed per manufacturer's recommendations.  Strings trimmed to 3 cm.   Patient given post  procedure instructions and Mirena care card with expiration date.  Patient is asked to check IUD strings periodically and follow up in 4-6 weeks for IUD check.  Assessment:    Healthy female exam.      Plan:   Problem List Items Addressed This Visit      Unprioritized   Obesity, Class III, BMI 40-49.9 (morbid obesity) ([place])    Discussed intermittent fasting, diet and lifestyle changes      Relevant Medications   glipiZIDE (GLUCOTROL XL) 5 MG 24 hr tablet   metFORMIN (GLUCOPHAGE-XR) 500 MG 24 hr tablet    Other Visit Diagnoses    Encounter for IUD removal and reinsertion    -  Primary   continue x 6-7 years.   Screening for malignant neoplasm of cervix       Relevant Orders   PAP over 30   Encounter for gynecological examination without abnormal finding       Relevant Orders   Mammogram Screening Bilateral Tomo        See After Visit Summary for Counseling Recommendations

## 2019-02-03 NOTE — Patient Instructions (Signed)
 Preventive Care 21-46 Years Old, Female Preventive care refers to visits with your health care provider and lifestyle choices that can promote health and wellness. This includes:  A yearly physical exam. This may also be called an annual well check.  Regular dental visits and eye exams.  Immunizations.  Screening for certain conditions.  Healthy lifestyle choices, such as eating a healthy diet, getting regular exercise, not using drugs or products that contain nicotine and tobacco, and limiting alcohol use. What can I expect for my preventive care visit? Physical exam Your health care provider will check your:  Height and weight. This may be used to calculate body mass index (BMI), which tells if you are at a healthy weight.  Heart rate and blood pressure.  Skin for abnormal spots. Counseling Your health care provider may ask you questions about your:  Alcohol, tobacco, and drug use.  Emotional well-being.  Home and relationship well-being.  Sexual activity.  Eating habits.  Work and work environment.  Method of birth control.  Menstrual cycle.  Pregnancy history. What immunizations do I need?  Influenza (flu) vaccine  This is recommended every year. Tetanus, diphtheria, and pertussis (Tdap) vaccine  You may need a Td booster every 10 years. Varicella (chickenpox) vaccine  You may need this if you have not been vaccinated. Human papillomavirus (HPV) vaccine  If recommended by your health care provider, you may need three doses over 6 months. Measles, mumps, and rubella (MMR) vaccine  You may need at least one dose of MMR. You may also need a second dose. Meningococcal conjugate (MenACWY) vaccine  One dose is recommended if you are age 19-21 years and a first-year college student living in a residence hall, or if you have one of several medical conditions. You may also need additional booster doses. Pneumococcal conjugate (PCV13) vaccine  You may need  this if you have certain conditions and were not previously vaccinated. Pneumococcal polysaccharide (PPSV23) vaccine  You may need one or two doses if you smoke cigarettes or if you have certain conditions. Hepatitis A vaccine  You may need this if you have certain conditions or if you travel or work in places where you may be exposed to hepatitis A. Hepatitis B vaccine  You may need this if you have certain conditions or if you travel or work in places where you may be exposed to hepatitis B. Haemophilus influenzae type b (Hib) vaccine  You may need this if you have certain conditions. You may receive vaccines as individual doses or as more than one vaccine together in one shot (combination vaccines). Talk with your health care provider about the risks and benefits of combination vaccines. What tests do I need?  Blood tests  Lipid and cholesterol levels. These may be checked every 5 years starting at age 20.  Hepatitis C test.  Hepatitis B test. Screening  Diabetes screening. This is done by checking your blood sugar (glucose) after you have not eaten for a while (fasting).  Sexually transmitted disease (STD) testing.  BRCA-related cancer screening. This may be done if you have a family history of breast, ovarian, tubal, or peritoneal cancers.  Pelvic exam and Pap test. This may be done every 3 years starting at age 21. Starting at age 30, this may be done every 5 years if you have a Pap test in combination with an HPV test. Talk with your health care provider about your test results, treatment options, and if necessary, the need for more   tests. Follow these instructions at home: Eating and drinking   Eat a diet that includes fresh fruits and vegetables, whole grains, lean protein, and low-fat dairy.  Take vitamin and mineral supplements as recommended by your health care provider.  Do not drink alcohol if: ? Your health care provider tells you not to drink. ? You are  pregnant, may be pregnant, or are planning to become pregnant.  If you drink alcohol: ? Limit how much you have to 0-1 drink a day. ? Be aware of how much alcohol is in your drink. In the [place] one drink equals one 12 oz bottle of beer (355 mL), one 5 oz glass of wine (148 mL), or one 1 oz glass of hard liquor (44 mL). Lifestyle  Take daily care of your teeth and gums.  Stay active. Exercise for at least 30 minutes on 5 or more days each week.  Do not use any products that contain nicotine or tobacco, such as cigarettes, e-cigarettes, and chewing tobacco. If you need help quitting, ask your health care provider.  If you are sexually active, practice safe sex. Use a condom or other form of birth control (contraception) in order to prevent pregnancy and STIs (sexually transmitted infections). If you plan to become pregnant, see your health care provider for a preconception visit. What's next?  Visit your health care provider once a year for a well check visit.  Ask your health care provider how often you should have your eyes and teeth checked.  Stay up to date on all vaccines. This information is not intended to replace advice given to you by your health care provider. Make sure you discuss any questions you have with your health care provider. Document Released: 04/10/2001 Document Revised: 10/24/2017 Document Reviewed: 10/24/2017 Elsevier Patient Education  2020 [employer].

## 2019-02-03 NOTE — Progress Notes (Signed)
Would like new IUD placed Needs mammo referral

## 2019-02-03 NOTE — Assessment & Plan Note (Signed)
Discussed intermittent fasting, diet and lifestyle changes

## 2019-02-05 LAB — CYTOLOGY - PAP
Comment: NEGATIVE
Diagnosis: NEGATIVE
High risk HPV: NEGATIVE

## 2019-02-12 ENCOUNTER — Other Ambulatory Visit: Payer: Self-pay

## 2019-02-12 ENCOUNTER — Telehealth: Payer: Self-pay | Admitting: Radiology

## 2019-02-12 DIAGNOSIS — Z1231 Encounter for screening mammogram for malignant neoplasm of breast: Secondary | ICD-10-CM

## 2019-02-12 NOTE — Telephone Encounter (Signed)
Left message for patient that mammo is scheduled for 03/04/19 @ 10:40 @ [facility]

## 2019-03-04 ENCOUNTER — Ambulatory Visit
Admission: RE | Admit: 2019-03-04 | Discharge: 2019-03-04 | Disposition: A | Discharge status: Home / Self Care | Payer: BC Managed Care – PPO | Source: Ambulatory Visit | Attending: Family Medicine | Admitting: Family Medicine

## 2019-03-04 DIAGNOSIS — Z1231 Encounter for screening mammogram for malignant neoplasm of breast: Secondary | ICD-10-CM | POA: Diagnosis present

## 2019-05-25 ENCOUNTER — Other Ambulatory Visit: Payer: Self-pay | Admitting: Family Medicine

## 2019-05-25 DIAGNOSIS — I10 Essential (primary) hypertension: Secondary | ICD-10-CM

## 2019-09-22 ENCOUNTER — Other Ambulatory Visit: Payer: Self-pay | Admitting: Family Medicine

## 2019-09-22 DIAGNOSIS — I10 Essential (primary) hypertension: Secondary | ICD-10-CM

## 2019-10-21 ENCOUNTER — Other Ambulatory Visit: Payer: Self-pay

## 2019-10-21 ENCOUNTER — Other Ambulatory Visit: Payer: BC Managed Care – PPO

## 2019-10-21 DIAGNOSIS — Z20822 Contact with and (suspected) exposure to covid-19: Secondary | ICD-10-CM

## 2019-10-22 LAB — NOVEL CORONAVIRUS, NAA: SARS-CoV-2, NAA: NOT DETECTED

## 2019-10-22 LAB — SARS-COV-2, NAA 2 DAY TAT

## 2020-06-14 ENCOUNTER — Ambulatory Visit (INDEPENDENT_AMBULATORY_CARE_PROVIDER_SITE_OTHER): Payer: BC Managed Care – PPO

## 2020-06-14 ENCOUNTER — Other Ambulatory Visit (HOSPITAL_COMMUNITY)
Admission: RE | Admit: 2020-06-14 | Discharge: 2020-06-14 | Disposition: A | Discharge status: Home / Self Care | Payer: BC Managed Care – PPO | Source: Ambulatory Visit | Attending: Family Medicine | Admitting: Family Medicine

## 2020-06-14 ENCOUNTER — Other Ambulatory Visit: Payer: Self-pay

## 2020-06-14 ENCOUNTER — Encounter: Payer: Self-pay | Admitting: Radiology

## 2020-06-14 VITALS — BP 147/104 | HR 75

## 2020-06-14 DIAGNOSIS — Z113 Encounter for screening for infections with a predominantly sexual mode of transmission: Secondary | ICD-10-CM | POA: Insufficient documentation

## 2020-06-14 NOTE — Progress Notes (Signed)
SUBJECTIVE:  50 y.o. female here for STD screening. Denies abnormal vaginal bleeding or significant pelvic pain or fever. No UTI symptoms. No LMP recorded. (Menstrual status: IUD).  OBJECTIVE:  She appears well, afebrile. Urine dipstick: not done.  ASSESSMENT:  STD Screening     PLAN:  GC, chlamydia, trichomonas, probe sent to lab. Treatment: To be determined once lab results are received ROV prn if symptoms persist or worsen.   **Pt aware of bp reading today in office, pt states she has a lot going on and has not taken her medication.

## 2020-06-14 NOTE — Progress Notes (Signed)
Patient seen and assessed by nursing staff.  Agree with documentation and plan.  

## 2020-06-15 LAB — CERVICOVAGINAL ANCILLARY ONLY
Chlamydia: NEGATIVE
Comment: NEGATIVE
Comment: NEGATIVE
Comment: NORMAL
Neisseria Gonorrhea: NEGATIVE
Trichomonas: POSITIVE — AB

## 2020-06-16 ENCOUNTER — Telehealth: Payer: Self-pay

## 2020-06-16 IMAGING — US US BREAST*L* LIMITED INC AXILLA
1 series · 5 of 5 positions shown · non-contrast
Comparison: Previous exam(s).

CLINICAL DATA: Follow-up of a probable benign left axillary lymph
node originally seen on ultrasound dated 10/24/2016.

EXAM:
DIGITAL DIAGNOSTIC BILATERAL MAMMOGRAM WITH CAD AND TOMO
ULTRASOUND LEFT BREAST

[Series 1: us breast*left* limited inc axilla · 0.08mm/px · 5 of 5 slices shown]
[im 1/5]
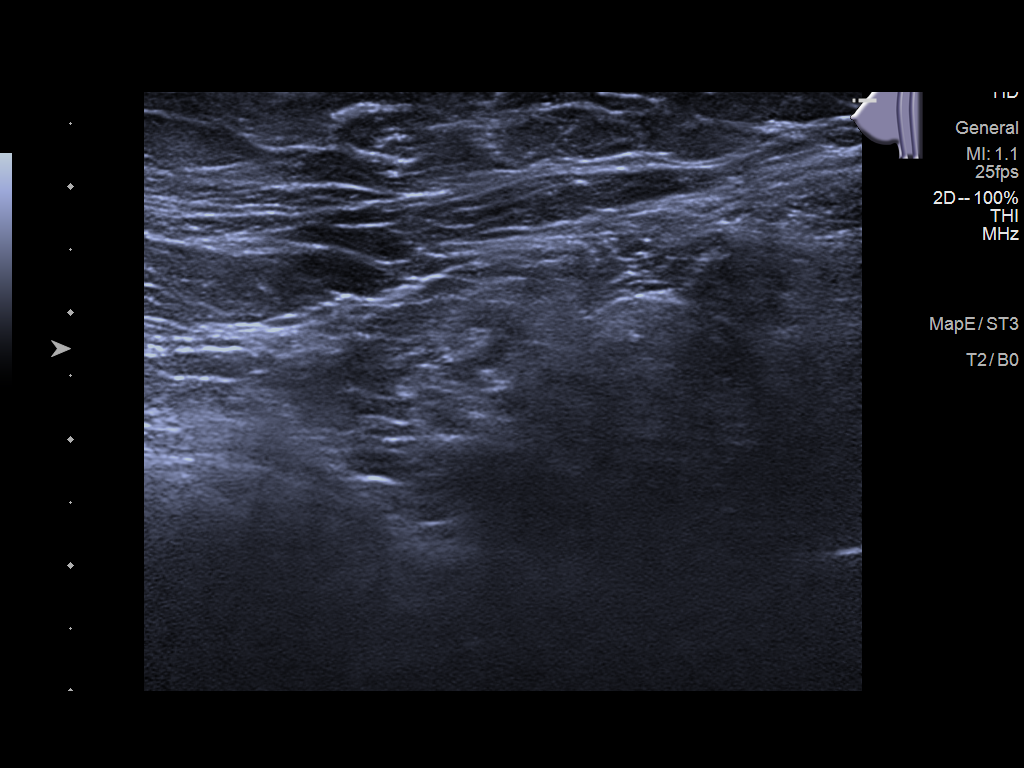
[im 2/5]
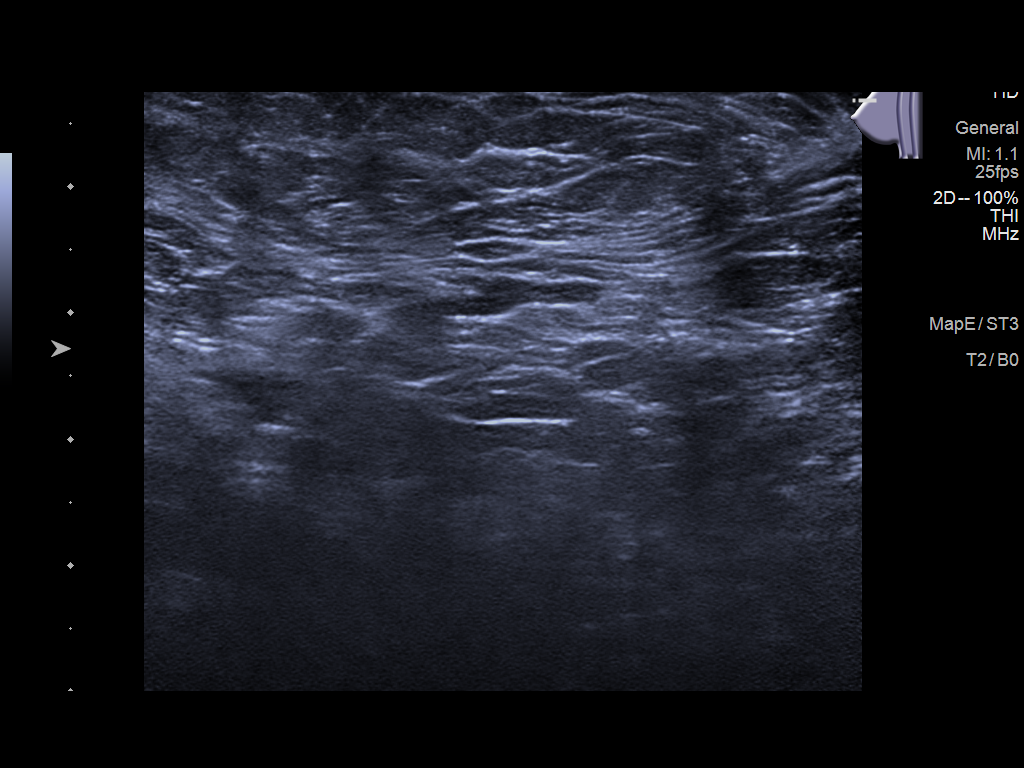
[im 3/5]
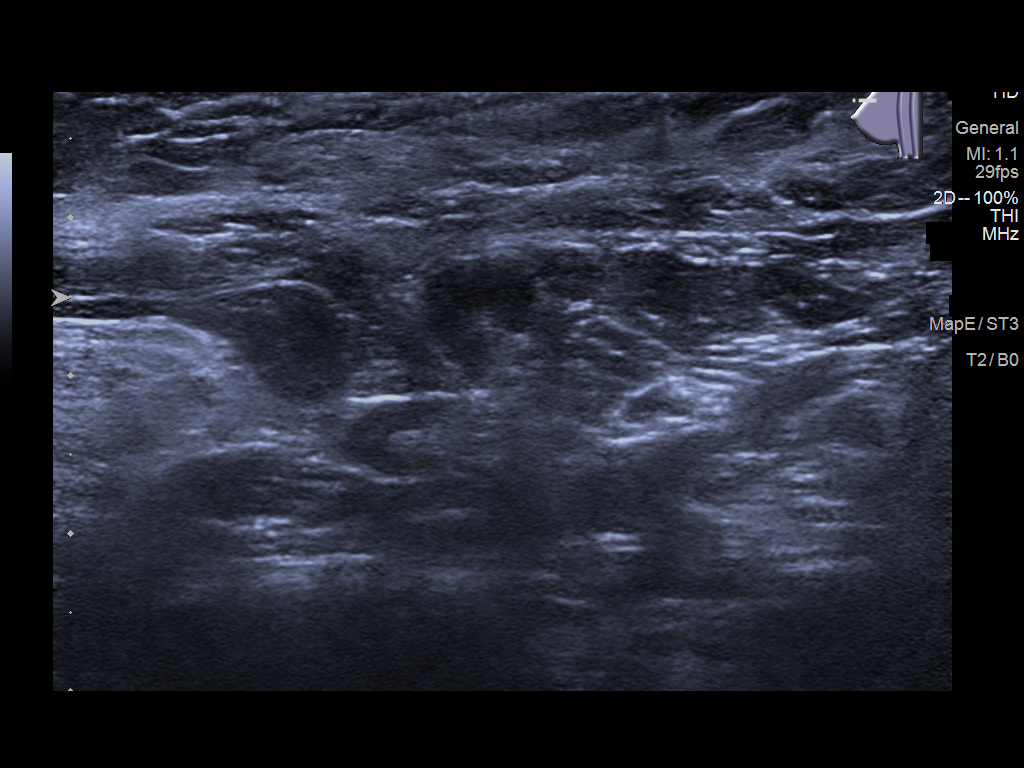
[im 4/5]
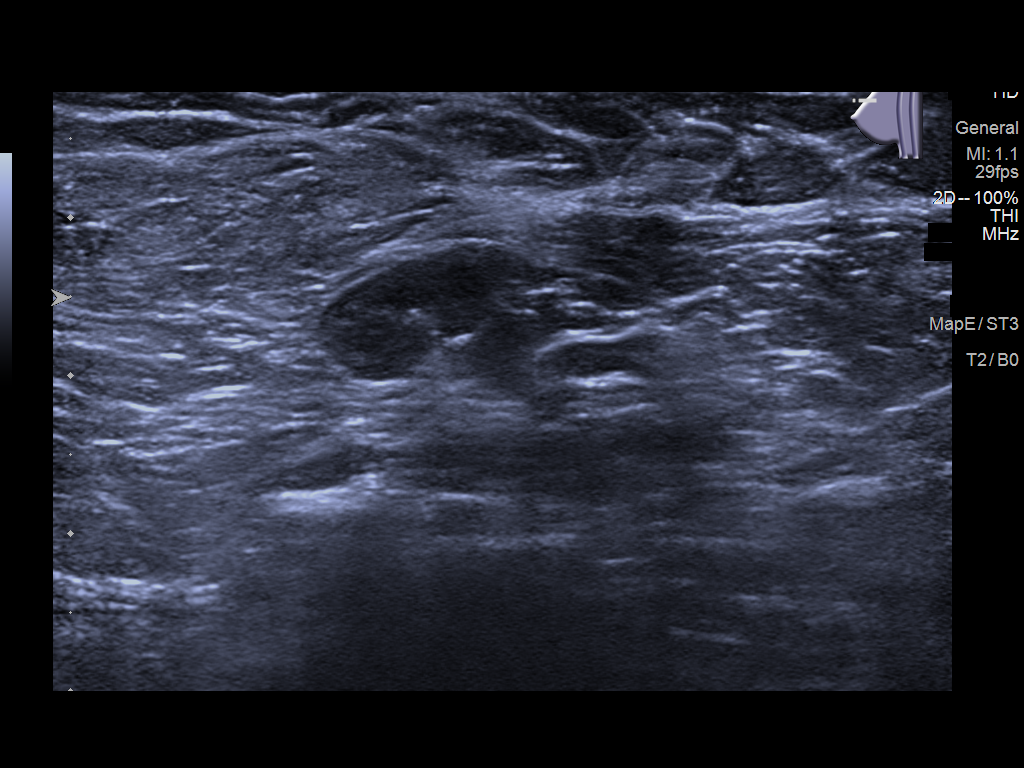
[im 5/5]
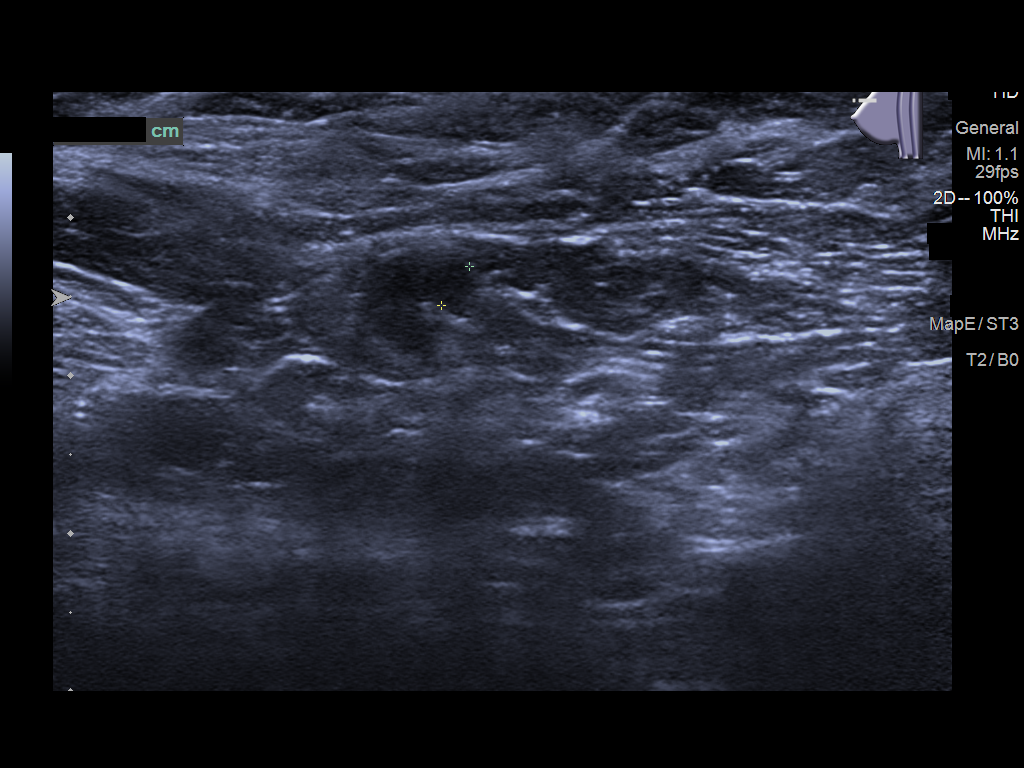

[5 of 5 positions shown; findings below may reference images not displayed]

ACR Breast Density Category b: There are scattered areas of
fibroglandular density.
FINDINGS: No suspicious mass, malignant type microcalcifications or distortion
detected in either breast. Patient has bilateral keloids.

Mammographic images were processed with CAD.

Targeted ultrasound is performed, showing there is a stable left
axillary lymph node with cortical thickening measuring 3 mm. It is
unchanged from the prior exam dated 10/24/2016.
IMPRESSION: Stable left axillary lymph node. No evidence of malignancy in either
breast.

RECOMMENDATION:
Bilateral screening mammogram in 1 year is recommended.

I have discussed the findings and recommendations with the patient.
If applicable, a reminder letter will be sent to the patient
regarding the next appointment.

BI-RADS CATEGORY  2: Benign.

## 2020-06-16 MED ORDER — METRONIDAZOLE 500 MG PO TABS: 500.0000 mg | ORAL_TABLET | Freq: Two times a day (BID) | ORAL | 0 refills | Status: DC

## 2020-06-16 NOTE — Telephone Encounter (Signed)
Pt called office requesting lab results. Results given and pt verbalizes understanding.  Pt advised to pick up medication and take as directed. Pt advised to abstain from intercourse for at least 7-10days after both her and partner have received treatment.

## 2020-06-16 NOTE — Telephone Encounter (Signed)
-----   Message from Donnamae Jude, MD sent at 06/16/2020  8:07 AM EDT ----- + for trich--Flagyl, sent in--please inform pt, and advise partner treatment.

## 2020-06-16 NOTE — Addendum Note (Signed)
Addended by: Donnamae Jude on: 06/16/2020 08:07 AM   Modules accepted: Orders

## 2020-07-12 ENCOUNTER — Other Ambulatory Visit: Payer: Self-pay

## 2020-07-12 ENCOUNTER — Ambulatory Visit (INDEPENDENT_AMBULATORY_CARE_PROVIDER_SITE_OTHER): Payer: BC Managed Care – PPO

## 2020-07-12 VITALS — BP 177/118 | HR 64

## 2020-07-12 DIAGNOSIS — Z113 Encounter for screening for infections with a predominantly sexual mode of transmission: Secondary | ICD-10-CM | POA: Diagnosis not present

## 2020-07-12 NOTE — Progress Notes (Signed)
Patient was assessed and managed by nursing staff during this encounter. I have reviewed the chart and agree with the documentation and plan.   Verita Schneiders, MD 07/12/2020 12:33 PM

## 2020-07-12 NOTE — Progress Notes (Signed)
SUBJECTIVE:  50 y.o. female presents to office for TOC. Denies abnormal vaginal bleeding or significant pelvic pain or fever. No UTI symptoms. Previous history of known exposure to STD.  No LMP recorded. (Menstrual status: IUD).  OBJECTIVE:  She appears well, afebrile. Urine dipstick: not done.  ASSESSMENT:  TOC   PLAN:  GC, chlamydia, trichomonas,  probe sent to lab. Treatment: To be determined once lab results are received  *Pt aware of blood pressure and states she has not taken bp medication yet this morning.

## 2020-07-13 LAB — CERVICOVAGINAL ANCILLARY ONLY
Chlamydia: NEGATIVE
Comment: NEGATIVE
Comment: NEGATIVE
Comment: NORMAL
Neisseria Gonorrhea: NEGATIVE
Trichomonas: NEGATIVE

## 2020-08-04 ENCOUNTER — Other Ambulatory Visit: Payer: Self-pay

## 2020-08-04 ENCOUNTER — Other Ambulatory Visit (HOSPITAL_COMMUNITY)
Admission: RE | Admit: 2020-08-04 | Discharge: 2020-08-04 | Disposition: A | Discharge status: Home / Self Care | Payer: BC Managed Care – PPO | Source: Ambulatory Visit | Attending: Obstetrics and Gynecology | Admitting: Obstetrics and Gynecology

## 2020-08-04 ENCOUNTER — Ambulatory Visit (INDEPENDENT_AMBULATORY_CARE_PROVIDER_SITE_OTHER): Payer: BC Managed Care – PPO

## 2020-08-04 VITALS — BP 153/101 | HR 81

## 2020-08-04 DIAGNOSIS — Z113 Encounter for screening for infections with a predominantly sexual mode of transmission: Secondary | ICD-10-CM | POA: Diagnosis not present

## 2020-08-04 NOTE — Progress Notes (Signed)
SUBJECTIVE:  50 y.o. female who desires a STI screen. Denies abnormal vaginal discharge, bleeding or significant pelvic pain. No UTI symptoms. Denies history of known exposure to STD.  No LMP recorded. (Menstrual status: IUD).  OBJECTIVE:  She appears well.   ASSESSMENT:  STI Screen   PLAN:  GC, chlamydia, and trichomonas probe sent to lab.  Treatment: To be determined once lab results are received.  Pt follow up as needed. Pt aware of elevated blood pressure, pt encouraged to reach out to PCP.

## 2020-08-05 LAB — CERVICOVAGINAL ANCILLARY ONLY
Chlamydia: NEGATIVE
Comment: NEGATIVE
Comment: NEGATIVE
Comment: NORMAL
Neisseria Gonorrhea: NEGATIVE
Trichomonas: NEGATIVE

## 2020-08-12 NOTE — Progress Notes (Signed)
Patient was assessed and managed by nursing staff during this encounter. I have reviewed the chart and agree with the documentation and plan. I have also made any necessary editorial changes.  Aletha Halim, MD 08/12/2020 3:07 PM

## 2020-09-05 ENCOUNTER — Other Ambulatory Visit (HOSPITAL_COMMUNITY)
Admission: RE | Admit: 2020-09-05 | Discharge: 2020-09-05 | Disposition: A | Discharge status: Home / Self Care | Payer: BC Managed Care – PPO | Source: Ambulatory Visit | Attending: Obstetrics and Gynecology | Admitting: Obstetrics and Gynecology

## 2020-09-05 ENCOUNTER — Other Ambulatory Visit: Payer: Self-pay

## 2020-09-05 ENCOUNTER — Encounter: Payer: Self-pay | Admitting: Obstetrics and Gynecology

## 2020-09-05 ENCOUNTER — Ambulatory Visit (INDEPENDENT_AMBULATORY_CARE_PROVIDER_SITE_OTHER): Payer: BC Managed Care – PPO | Admitting: Obstetrics and Gynecology

## 2020-09-05 VITALS — BP 144/103 | HR 69 | Wt 197.0 lb

## 2020-09-05 DIAGNOSIS — B373 Candidiasis of vulva and vagina: Secondary | ICD-10-CM

## 2020-09-05 DIAGNOSIS — B3731 Acute candidiasis of vulva and vagina: Secondary | ICD-10-CM

## 2020-09-05 DIAGNOSIS — D171 Benign lipomatous neoplasm of skin and subcutaneous tissue of trunk: Secondary | ICD-10-CM

## 2020-09-05 DIAGNOSIS — Z01419 Encounter for gynecological examination (general) (routine) without abnormal findings: Secondary | ICD-10-CM | POA: Insufficient documentation

## 2020-09-05 DIAGNOSIS — L910 Hypertrophic scar: Secondary | ICD-10-CM

## 2020-09-05 MED ORDER — FLUCONAZOLE 150 MG PO TABS: 150.0000 mg | ORAL_TABLET | ORAL | 2 refills | Status: AC

## 2020-09-05 MED ORDER — METRONIDAZOLE 500 MG PO TABS: 500.0000 mg | ORAL_TABLET | Freq: Two times a day (BID) | ORAL | 2 refills | Status: DC

## 2020-09-05 NOTE — Progress Notes (Signed)
Obstetrics and Gynecology Annual Patient Evaluation  Appointment Date: 09/05/2020  OBGYN Clinic: [facility]  Primary Care Provider: Derinda Late  Chief Complaint:  Chief Complaint  Patient presents with   Gynecologic Exam    History of Present Illness: Brittany Grant is a 50 y.o. [phone] (No LMP recorded. (Menstrual status: IUD).), seen for the above chief complaint. Her past medical history is significant for Mirena IUD in place, BMI  40s, HTN, DM2.  She does endorse some hot flashes, ?vaginal dryness  Review of Systems: Pertinent items noted in HPI and remainder of comprehensive ROS otherwise negative.    Patient Active Problem List   Diagnosis Date Noted   Obesity, Class III, BMI 40-49.9 (morbid obesity) ([place]) 02/03/2019   Heart palpitations 09/18/2017   Atypical chest pain 09/18/2017   Anxiety 03/22/2017   Diabetes mellitus without complication ([place]) 23/76/2831   HTN (hypertension) 05/01/2010   GERD 05/01/2010    Past Medical History:  Past Medical History:  Diagnosis Date   GERD (gastroesophageal reflux disease)    Hemorrhoid    Hypertension    IUD 04/26/2010   Obesity    Scarring, keloid    Trichimoniasis     Past Surgical History:  Past Surgical History:  Procedure Laterality Date   KELOID EXCISION      Past Obstetrical History:  OB History  Gravida Para Term Preterm AB Living  4 3 3  0 1 1  SAB IAB Ectopic Multiple Live Births  1 0 0   1    # Outcome Date GA Lbr Len/2nd Weight Sex Delivery Anes PTL Lv  4 Term 08/09/12 [redacted]w[redacted]d 00:43 / 00:18 7 lb 0.5 oz (3.189 kg) F Vag-Vacuum None  LIV     Birth Comments: WNL  3 Term 07/21/03 [redacted]w[redacted]d  7 lb (3.175 kg) M Vag-Spont     2 Term 10/07/94 [redacted]w[redacted]d  6 lb 12 oz (3.062 kg) M Vag-Spont     1 SAB              Past Gynecological History: As per HPI. Periods: rare with IUD History of Pap Smear(s): Yes.   Last pap 01/2019, which was negative She is currently using  Mirena  (placed12/09/2018 ) for contraception.   Social History:  Social History   Socioeconomic History   Marital status: Married    Spouse name: Not on file   Number of children: Not on file   Years of education: Not on file   Highest education level: Not on file  Occupational History   Occupation: Surveyor, minerals: [employer]  Tobacco Use   Smoking status: Former    Years: 10.00    Pack years: 0.00    Types: Cigarettes    Quit date: 01/23/2012    Years since quitting: 8.6   Smokeless tobacco: Never   Tobacco comments:    one a day  Substance and Sexual Activity   Alcohol use: No   Drug use: No   Sexual activity: Yes    Partners: Male    Birth control/protection: I.U.D.  Other Topics Concern   Not on file  Social History Narrative   ** Merged History Encounter **       Social Determinants of Health   Financial Resource Strain: Not on file  Food Insecurity: Not on file  Transportation Needs: Not on file  Physical Activity: Not on file  Stress: Not on file  Social Connections: Not on file  Intimate Partner Violence: Not on file    Family History:  Family History  Problem Relation Age of Onset   Hypertension Other    Diabetes Mother    Hypertension Mother    Diabetes Father    Hypertension Father    Breast cancer Maternal Grandmother     Health Maintenance:  Mammogram(s): Yes.   Date: 02/2019  Medications Yue C. Copen had no medications administered during this visit. Current Outpatient Medications  Medication Sig Dispense Refill   cyclobenzaprine (FLEXERIL) 5 MG tablet Take by mouth.     escitalopram (LEXAPRO) 10 MG tablet Take 10 mg by mouth daily.     glipiZIDE (GLUCOTROL XL) 5 MG 24 hr tablet Take by mouth.     levonorgestrel (MIRENA) 20 MCG/24HR IUD by Intrauterine route.     lisinopril-hydrochlorothiazide (ZESTORETIC) 20-25 MG tablet Take 1 tablet by mouth once daily 90 tablet 0   metFORMIN (GLUCOPHAGE)  500 MG tablet Take 1,000 mg by mouth daily with breakfast.      metFORMIN (GLUCOPHAGE-XR) 500 MG 24 hr tablet Take 500 mg by mouth 2 (two) times daily.     metroNIDAZOLE (FLAGYL) 500 MG tablet Take 1 tablet (500 mg total) by mouth 2 (two) times daily. (Patient not taking: Reported on 09/05/2020) 14 tablet 0   vitamin B-12 (CYANOCOBALAMIN) 1000 MCG tablet Take by mouth. (Patient not taking: Reported on 09/05/2020)     No current facility-administered medications for this visit.    Allergies Patient has no known allergies.   Physical Exam:  BP (!) 144/103   Pulse 69   Wt 197 lb (89.4 kg)   BMI 38.47 kg/m  Body mass index is 38.47 kg/m. General appearance: Well nourished, well developed female in no acute distress.  Neck:  Supple, normal appearance, and no thyromegaly  Cardiovascular: normal s1 and s2.  No murmurs, rubs or gallops. Respiratory:  Clear to auscultation bilateral. Normal respiratory effort Abdomen: positive bowel sounds and no masses, hernias; diffusely non tender to palpation, non distended Breasts: breasts appear normal, no suspicious masses, no skin or nipple changes or axillary nodes, and negative palpation. Neuro/Psych:  Normal mood and affect.  Skin:  Warm and dry.  Lymphatic:  No inguinal lymphadenopathy.   Pelvic exam: is not limited by body habitus EGBUS: b/l labia c/w rice paper like consistency, normal skin color Vagina: white cottage cheese like d/c in vault, no blood Cervix: normal appearing cervix without tenderness, discharge or lesions. IUD strings 3-4cm Uterus:  nonenlarged and non tender Adnexa:  normal adnexa and no mass, fullness, tenderness Rectovaginal: deferred. 4-5cm somewhat pedunculated lipoma (nttp, fluctuant, normal skin color) at the right gluteal fold   Laboratory: none  Radiology: none  Assessment: pt stable  Plan:  1. Well woman exam Routine care - Cytology - PAP - Hepatitis B surface antigen - Hepatitis C antibody - HIV  Antibody (routine testing w rflx) - RPR - MM 3D SCREEN BREAST BILATERAL; Future  2. Vulvovaginal candidiasis Last a1c was 7.5 earlier this year. Pt states CBG control is okay but is getting better. I told her that this will help with any repeat infections if she gets her CBGs under better control. Flagyl and diflucan course sent in  3. Lipoma of buttock Pt states that it has been there for years and she says it's not that bothersome. I offered referral to someone for removal and she declines this because she has issues with keloid. I told her that it looks benign and I recommend  someone looking at it qyr to make sure it looks okay and that if she ever desires to have it removed, I would recommend dermatology or plastic surgery as they may have the best new data on what can be done to reduce risk of keloiding  Orders Placed This Encounter  Procedures   MM 3D SCREEN BREAST BILATERAL   Hepatitis B surface antigen   Hepatitis C antibody   HIV Antibody (routine testing w rflx)   RPR    RTC PRN  Durene Romans MD Attending [facility])

## 2020-09-06 DIAGNOSIS — L910 Hypertrophic scar: Secondary | ICD-10-CM | POA: Insufficient documentation

## 2020-09-12 LAB — CYTOLOGY - PAP
Chlamydia: NEGATIVE
Comment: NEGATIVE
Comment: NEGATIVE
Comment: NEGATIVE
Comment: NORMAL
Diagnosis: NEGATIVE
High risk HPV: NEGATIVE
Neisseria Gonorrhea: NEGATIVE
Trichomonas: NEGATIVE

## 2020-09-13 ENCOUNTER — Ambulatory Visit
Admission: RE | Admit: 2020-09-13 | Discharge: 2020-09-13 | Disposition: A | Discharge status: Home / Self Care | Payer: BC Managed Care – PPO | Source: Ambulatory Visit | Attending: Obstetrics and Gynecology | Admitting: Obstetrics and Gynecology

## 2020-09-13 ENCOUNTER — Other Ambulatory Visit: Payer: Self-pay

## 2020-09-13 DIAGNOSIS — Z1231 Encounter for screening mammogram for malignant neoplasm of breast: Secondary | ICD-10-CM | POA: Diagnosis not present

## 2020-09-13 DIAGNOSIS — N6310 Unspecified lump in the right breast, unspecified quadrant: Secondary | ICD-10-CM | POA: Insufficient documentation

## 2020-09-13 DIAGNOSIS — Z01419 Encounter for gynecological examination (general) (routine) without abnormal findings: Secondary | ICD-10-CM

## 2020-09-15 ENCOUNTER — Other Ambulatory Visit: Payer: Self-pay | Admitting: Obstetrics and Gynecology

## 2020-09-15 DIAGNOSIS — R928 Other abnormal and inconclusive findings on diagnostic imaging of breast: Secondary | ICD-10-CM

## 2020-09-15 DIAGNOSIS — N6310 Unspecified lump in the right breast, unspecified quadrant: Secondary | ICD-10-CM

## 2020-09-23 ENCOUNTER — Ambulatory Visit
Admission: RE | Admit: 2020-09-23 | Discharge: 2020-09-23 | Disposition: A | Discharge status: Home / Self Care | Payer: BC Managed Care – PPO | Source: Ambulatory Visit | Attending: Obstetrics and Gynecology | Admitting: Obstetrics and Gynecology

## 2020-09-23 ENCOUNTER — Other Ambulatory Visit: Payer: Self-pay

## 2020-09-23 DIAGNOSIS — N6310 Unspecified lump in the right breast, unspecified quadrant: Secondary | ICD-10-CM | POA: Insufficient documentation

## 2020-09-23 DIAGNOSIS — R928 Other abnormal and inconclusive findings on diagnostic imaging of breast: Secondary | ICD-10-CM | POA: Diagnosis present

## 2021-04-19 ENCOUNTER — Encounter: Payer: Self-pay | Admitting: Family Medicine

## 2021-04-19 ENCOUNTER — Ambulatory Visit: Payer: BC Managed Care – PPO | Admitting: Family Medicine

## 2021-04-19 NOTE — Progress Notes (Signed)
Patient did not keep appointment today. She may call to reschedule.  

## 2021-08-08 ENCOUNTER — Other Ambulatory Visit: Payer: Self-pay | Admitting: Family Medicine

## 2022-01-06 IMAGING — MG MM DIGITAL DIAGNOSTIC UNILAT*R* W/ TOMO W/ CAD
4 series · 4 of 12 positions shown · non-contrast
Comparison: Previous exam(s).

CLINICAL DATA: Recall from screening mammography, possible mass
involving the LOWER OUTER QUADRANT of the RIGHT breast at posterior
depth.

EXAM:
DIGITAL DIAGNOSTIC UNILATERAL RIGHT MAMMOGRAM WITH TOMOSYNTHESIS AND
CAD; ULTRASOUND RIGHT BREAST LIMITED
TECHNIQUE: Right digital diagnostic mammography and breast tomosynthesis was
performed. The images were evaluated with computer-aided detection.;
Targeted ultrasound examination of the right breast was performed

[R MLO synth-2D]
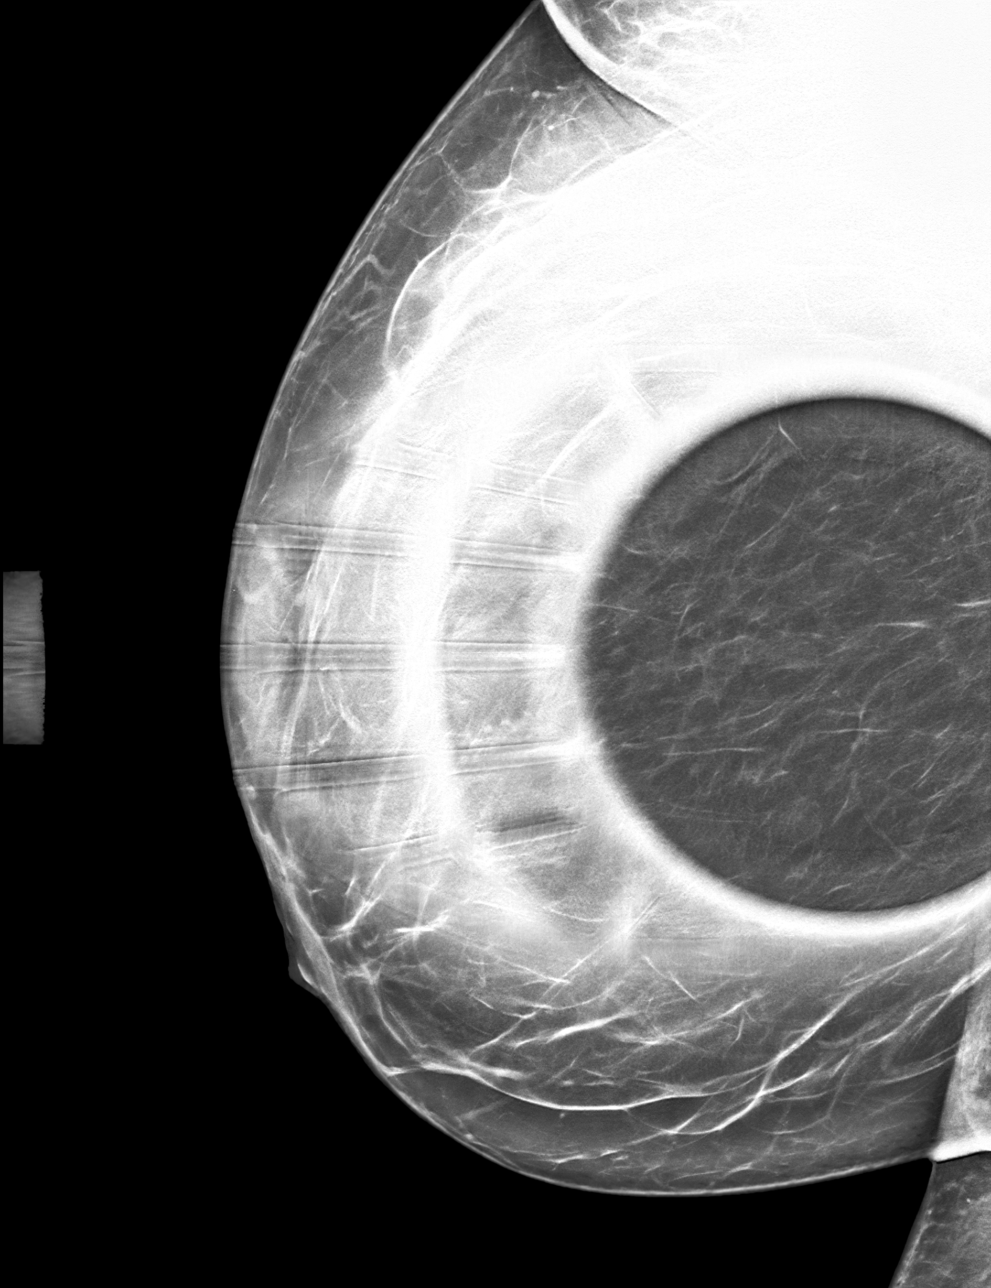

[R CC synth-2D]
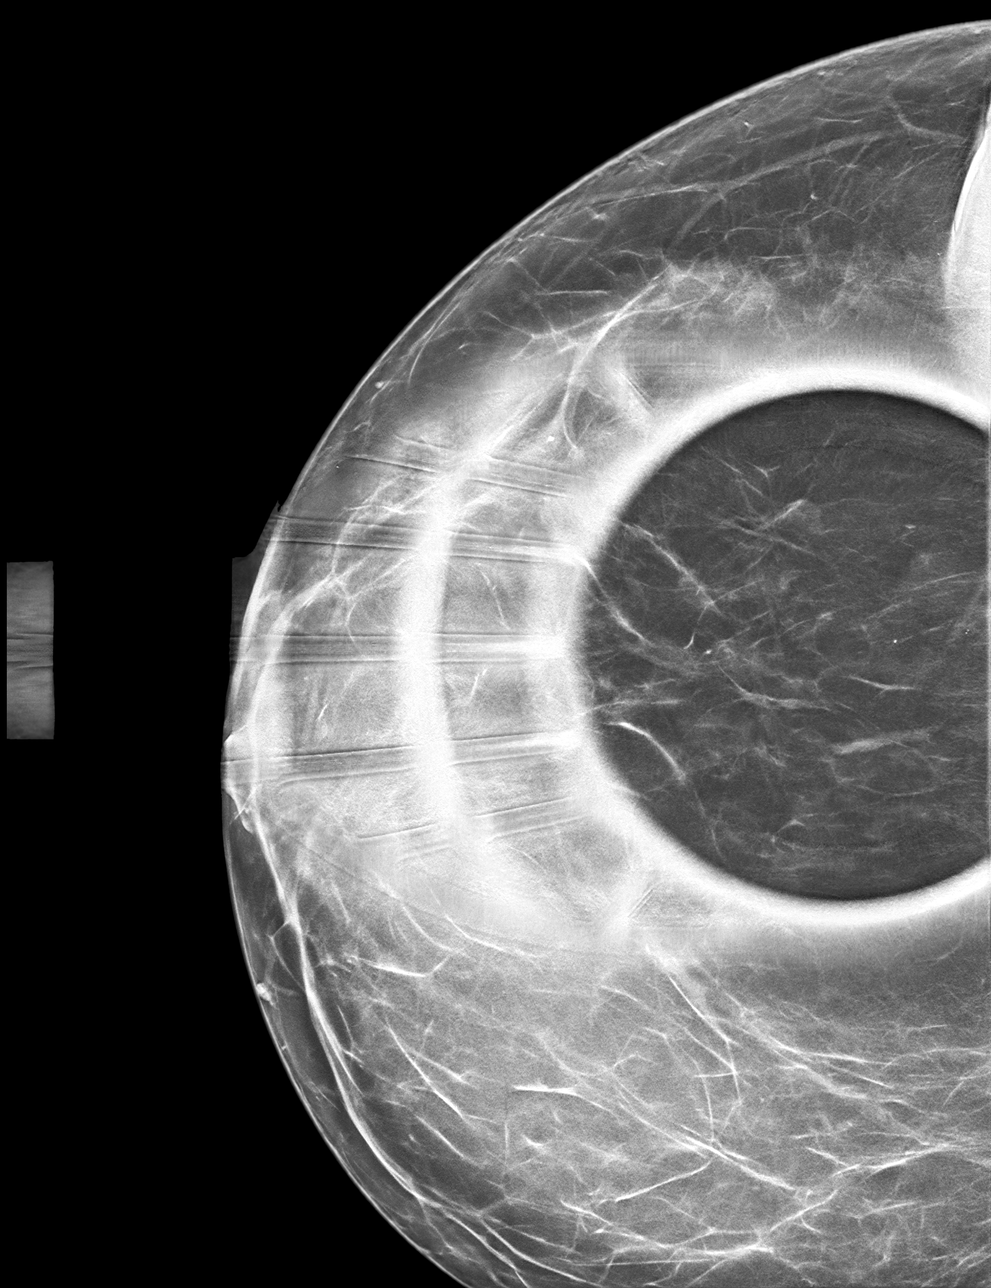

[R MLO tomo · tomo slice 35/70.0]
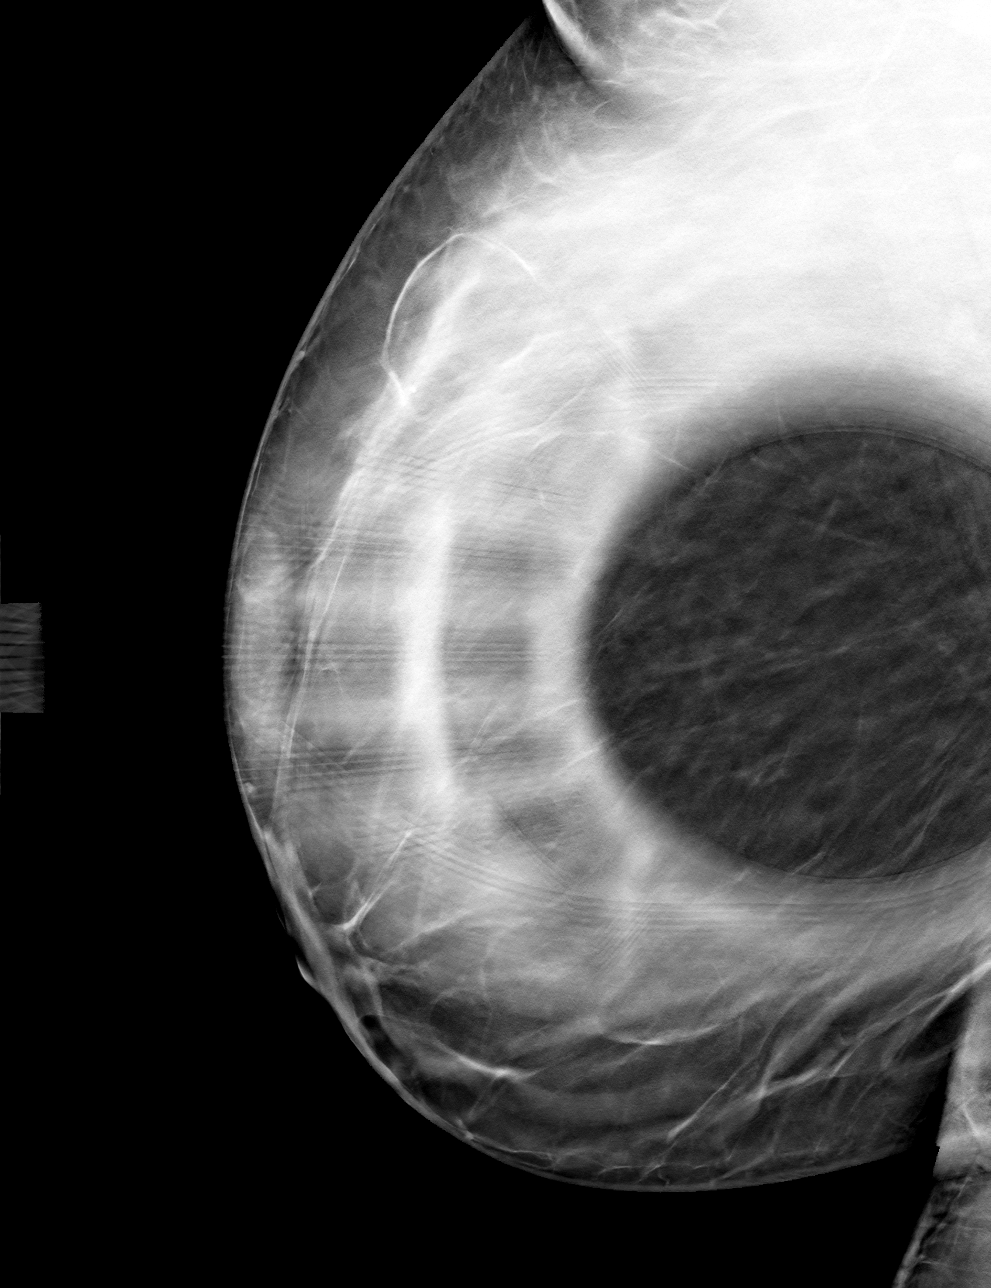

[R CC tomo · tomo slice 33/65.0]
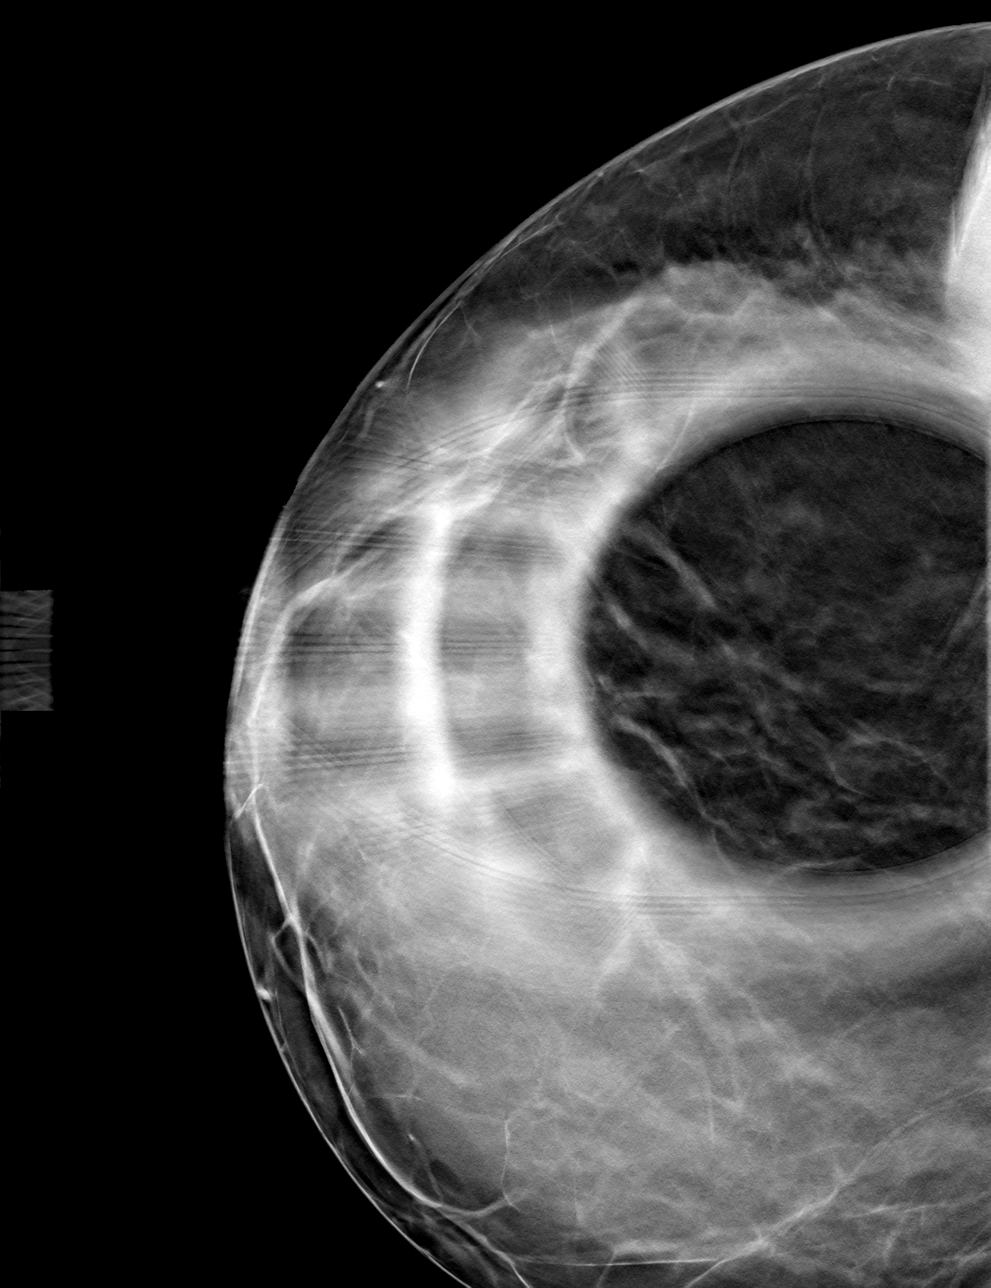

[4 of 12 positions shown; findings below may reference images not displayed]

ACR Breast Density Category b: There are scattered areas of
fibroglandular density.
FINDINGS: Spot-compression CC and MLO views of the area of concern were
obtained.

These images confirm a partially compressible isodense 7 mm mass in
the LOWER OUTER QUADRANT at posterior depth. There is no associated
architectural distortion or suspicious calcifications.

Targeted ultrasound is performed, demonstrating a benign clustered
cysts at the 8 o'clock position approximately 6 cm from nipple
measuring approximately 7 x 4 x 7 mm, demonstrating posterior
acoustic enhancement and no internal power Doppler flow, accounting
for the screening mammographic finding. No suspicious solid mass or
abnormal acoustic shadowing is identified.
IMPRESSION: 1. No mammographic or sonographic evidence of malignancy involving
the RIGHT breast.
2. Benign clustered cysts in the LOWER OUTER QUADRANT which accounts
for the screening mammographic finding.

RECOMMENDATION:
Screening mammogram in one year.(Code:0T-[id number])

I have discussed the findings and recommendations with the patient.
If applicable, a reminder letter will be sent to the patient
regarding the next appointment.

BI-RADS CATEGORY  2: Benign.

## 2022-08-06 ENCOUNTER — Telehealth: Payer: Self-pay

## 2022-08-06 NOTE — Telephone Encounter (Signed)
Spoke to patient and scheduled an appt.

## 2022-08-14 ENCOUNTER — Encounter: Payer: Self-pay | Admitting: Family Medicine

## 2022-08-16 ENCOUNTER — Emergency Department
Admission: EM | Admit: 2022-08-16 | Discharge: 2022-08-16 | Disposition: A | Discharge status: Home / Self Care | Payer: No Typology Code available for payment source | Attending: Emergency Medicine | Admitting: Emergency Medicine

## 2022-08-16 ENCOUNTER — Other Ambulatory Visit: Payer: Self-pay

## 2022-08-16 ENCOUNTER — Emergency Department: Payer: No Typology Code available for payment source

## 2022-08-16 DIAGNOSIS — R0602 Shortness of breath: Secondary | ICD-10-CM | POA: Diagnosis not present

## 2022-08-16 DIAGNOSIS — R002 Palpitations: Secondary | ICD-10-CM | POA: Insufficient documentation

## 2022-08-16 DIAGNOSIS — R0789 Other chest pain: Secondary | ICD-10-CM | POA: Diagnosis present

## 2022-08-16 DIAGNOSIS — I10 Essential (primary) hypertension: Secondary | ICD-10-CM | POA: Diagnosis not present

## 2022-08-16 DIAGNOSIS — B349 Viral infection, unspecified: Secondary | ICD-10-CM | POA: Insufficient documentation

## 2022-08-16 DIAGNOSIS — R079 Chest pain, unspecified: Secondary | ICD-10-CM

## 2022-08-16 LAB — BASIC METABOLIC PANEL
Anion gap: 11 (ref 5–15)
BUN: 14 mg/dL (ref 6–20)
CO2: 21 mmol/L — ABNORMAL LOW (ref 22–32)
Calcium: 9.2 mg/dL (ref 8.9–10.3)
Chloride: 104 mmol/L (ref 98–111)
Creatinine, Ser: 0.75 mg/dL (ref 0.44–1.00)
GFR, Estimated: >60 mL/min (ref >60)
Glucose, Bld: 161 mg/dL — ABNORMAL HIGH (ref 70–99)
Potassium: 3.5 mmol/L (ref 3.5–5.1)
Sodium: 136 mmol/L (ref 135–145)

## 2022-08-16 LAB — CBC
HCT: 43.3 % (ref 36.0–46.0)
Hemoglobin: 13.6 g/dL (ref 12.0–15.0)
MCH: 22.1 pg — ABNORMAL LOW (ref 26.0–34.0)
MCHC: 31.4 g/dL (ref 30.0–36.0)
MCV: 70.4 fL — ABNORMAL LOW (ref 80.0–100.0)
Platelets: 244 10*3/uL (ref 150–400)
RBC: 6.15 MIL/uL — ABNORMAL HIGH (ref 3.87–5.11)
RDW: 15.9 % — ABNORMAL HIGH (ref 11.5–15.5)
WBC: 7.4 10*3/uL (ref 4.0–10.5)
nRBC: 0 % (ref 0.0–0.2)

## 2022-08-16 LAB — TROPONIN I (HIGH SENSITIVITY): Troponin I (High Sensitivity): 4 ng/L (ref <18)

## 2022-08-16 NOTE — ED Notes (Signed)
First nurse note: Pt to ED from [facility] for squeezing chest pain, SOB and nausea since 4 days. Pt in NAD, skin dry. Hx DM, HTN. EKG performed.

## 2022-08-16 NOTE — ED Triage Notes (Signed)
Pt here with cp since Monday. Pt states pain is centered and radiates to her right arm. Pt states when she gets up to go to the bathroom her heart starts racing. Pt also having SOB and nausea. Pt describes the pain as squeezing in nature.

## 2022-08-16 NOTE — ED Provider Notes (Signed)
[facility] Provider Note    Event Date/Time   First MD Initiated Contact with Patient 08/16/22 1531     (approximate)   History   Chief Complaint Chest Pain   HPI  Brittany Grant is a 52 y.o. female with past medical history of hypertension, GERD, and anxiety who presents to the ED complaining of chest pain.  Patient reports that about 5 days ago she initially developed cough, nausea, weakness, malaise, and diarrhea.  She denies any fevers, but reports increasing tightness in her chest over the past 2 days with palpitations when she walks.  She reports feeling slightly short of breath with walking, denies difficulty breathing at rest.  She has not had any abdominal pain, vomiting, or dysuria.  She is not aware of any sick contacts.     Physical Exam   Triage Vital Signs: ED Triage Vitals  Enc Vitals Group     BP 08/16/22 1400 (!) 169/116     Pulse Rate 08/16/22 1400 65     Resp 08/16/22 1400 18     Temp 08/16/22 1400 97.9 F (36.6 C)     Temp Source 08/16/22 1400 Oral     SpO2 08/16/22 1400 100 %     Weight 08/16/22 1401 197 lb 1.5 oz (89.4 kg)     Height 08/16/22 1401 5' (1.524 m)     Head Circumference --      Peak Flow --      Pain Score 08/16/22 1401 5     Pain Loc --      Pain Edu? --      Excl. in GC? --     Most recent vital signs: Vitals:   08/16/22 1400  BP: (!) 169/116  Pulse: 65  Resp: 18  Temp: 97.9 F (36.6 C)  SpO2: 100%    Constitutional: Alert and oriented. Eyes: Conjunctivae are normal. Head: Atraumatic. Nose: No congestion/rhinnorhea. Mouth/Throat: Mucous membranes are moist.  Cardiovascular: Normal rate, regular rhythm. Grossly normal heart sounds.  2+ radial pulses bilaterally. Respiratory: Normal respiratory effort.  No retractions. Lungs CTAB. Gastrointestinal: Soft and nontender. No distention. Musculoskeletal: No lower extremity tenderness nor edema.  Neurologic:  Normal speech and language. No gross  focal neurologic deficits are appreciated.    ED Results / Procedures / Treatments   Labs (all labs ordered are listed, but only abnormal results are displayed) Labs Reviewed  BASIC METABOLIC PANEL - Abnormal; Notable for the following components:      Result Value   CO2 21 (*)    Glucose, Bld 161 (*)    All other components within normal limits  CBC - Abnormal; Notable for the following components:   RBC 6.15 (*)    MCV 70.4 (*)    MCH 22.1 (*)    RDW 15.9 (*)    All other components within normal limits  POC URINE PREG, ED  TROPONIN I (HIGH SENSITIVITY)     EKG  ED ECG REPORT I, Chesley Noon, the attending physician, personally viewed and interpreted this ECG.   Date: 08/16/2022  EKG Time: 14:01  Rate: 70  Rhythm: normal sinus rhythm  Axis: Normal  Intervals:first-degree A-V block   ST&T Change: None  RADIOLOGY Chest x-ray reviewed and interpreted by me with cardiomegaly, no focal infiltrate, edema, or effusion noted.  PROCEDURES:  Critical Care performed: No  Procedures   MEDICATIONS ORDERED IN ED: Medications - No data to display   IMPRESSION / MDM / ASSESSMENT  AND PLAN / ED COURSE  I reviewed the triage vital signs and the nursing notes.                              52 y.o. female with past medical history of hypertension, GERD, and anxiety who presents to the ED complaining of palpitations and chest discomfort after dealing with cough, nausea, diarrhea, and malaise earlier in the week.  Patient's presentation is most consistent with acute presentation with potential threat to life or bodily function.  Differential diagnosis includes, but is not limited to, ACS, arrhythmia, PE, pneumonia, pneumothorax, dehydration, electrolyte abnormality, AKI, anemia, viral syndrome.  Patient well-appearing and in no acute distress, vital signs remarkable for hypertension but otherwise reassuring.  EKG shows no evidence of arrhythmia or ischemia and troponin  within normal limits, doubt ACS given chest tightness for multiple days.  Chest x-ray is unremarkable and remainder of labs are reassuring with no significant anemia, leukocytosis, lecture abnormality, or AKI.  Symptoms sound most consistent with viral syndrome, no arrhythmia noted on cardiac monitor.  Patient reports she is not currently sexually active and there is no chance that she could be pregnant.  She declined testing for COVID-19 and influenza.  She is appropriate for outpatient management, was counseled to follow-up with her PCP and to return to the ED for new or worsening symptoms, patient agrees with plan.      FINAL CLINICAL IMPRESSION(S) / ED DIAGNOSES   Final diagnoses:  Nonspecific chest pain  Palpitations  Viral syndrome     Rx / DC Orders   ED Discharge Orders     None        Note:  This document was prepared using Dragon voice recognition software and may include unintentional dictation errors.   Chesley Noon, MD 08/16/22 1655

## 2022-08-17 ENCOUNTER — Other Ambulatory Visit: Payer: Self-pay | Admitting: Obstetrics and Gynecology

## 2022-09-05 ENCOUNTER — Encounter: Payer: Self-pay | Admitting: Family Medicine

## 2022-09-05 ENCOUNTER — Other Ambulatory Visit (HOSPITAL_COMMUNITY)
Admission: RE | Admit: 2022-09-05 | Discharge: 2022-09-05 | Disposition: A | Discharge status: Home / Self Care | Payer: No Typology Code available for payment source | Source: Ambulatory Visit | Attending: Family Medicine | Admitting: Family Medicine

## 2022-09-05 ENCOUNTER — Ambulatory Visit: Payer: No Typology Code available for payment source | Admitting: Family Medicine

## 2022-09-05 VITALS — BP 157/109 | HR 74 | Ht 60.0 in | Wt 196.0 lb

## 2022-09-05 DIAGNOSIS — Z01419 Encounter for gynecological examination (general) (routine) without abnormal findings: Secondary | ICD-10-CM | POA: Diagnosis not present

## 2022-09-05 DIAGNOSIS — Z124 Encounter for screening for malignant neoplasm of cervix: Secondary | ICD-10-CM

## 2022-09-05 DIAGNOSIS — I10 Essential (primary) hypertension: Secondary | ICD-10-CM | POA: Diagnosis not present

## 2022-09-05 DIAGNOSIS — N949 Unspecified condition associated with female genital organs and menstrual cycle: Secondary | ICD-10-CM | POA: Diagnosis present

## 2022-09-05 DIAGNOSIS — Z1231 Encounter for screening mammogram for malignant neoplasm of breast: Secondary | ICD-10-CM

## 2022-09-05 DIAGNOSIS — A5901 Trichomonal vulvovaginitis: Secondary | ICD-10-CM | POA: Insufficient documentation

## 2022-09-05 DIAGNOSIS — Z1339 Encounter for screening examination for other mental health and behavioral disorders: Secondary | ICD-10-CM | POA: Diagnosis not present

## 2022-09-05 DIAGNOSIS — Z113 Encounter for screening for infections with a predominantly sexual mode of transmission: Secondary | ICD-10-CM | POA: Diagnosis not present

## 2022-09-05 DIAGNOSIS — Z1151 Encounter for screening for human papillomavirus (HPV): Secondary | ICD-10-CM | POA: Insufficient documentation

## 2022-09-05 DIAGNOSIS — N9489 Other specified conditions associated with female genital organs and menstrual cycle: Secondary | ICD-10-CM | POA: Insufficient documentation

## 2022-09-05 NOTE — Progress Notes (Signed)
Patient presents for Annual.  LMP: notes bleeding with IUD recently. Last pap: Date: 09/05/20 Contraception: IUD: Mirena Mammogram:  04/2021   STD Screening: Declines Flu Vaccine : N/A  CC:  vaginal burning sensation tried Monistat 7 , bleeding yet none today .  Fun Fact:  Patient loves to read.

## 2022-09-05 NOTE — Assessment & Plan Note (Addendum)
04540 - Poorly controlled, on meds---f/u PCP Stress reduction/self care, urged mindfulness, yoga, meditation

## 2022-09-05 NOTE — Assessment & Plan Note (Signed)
16109 - check wet prep and treat accordingly

## 2022-09-05 NOTE — Progress Notes (Addendum)
Subjective:     Brittany Grant is a 52 y.o. female and is here for a comprehensive physical exam. The patient reports problems - vaginal irritation, has tried Monistat without relief. Has IUD in place since 2020, no cycles but having bleeding with intercourse.   The following portions of the patient's history were reviewed and updated as appropriate: allergies, current medications, past family history, past medical history, past social history, past surgical history, and problem list.  Review of Systems Pertinent items noted in HPI and remainder of comprehensive ROS otherwise negative.   Objective:  Chaperone present for exam   BP (!) 157/109   Pulse 74   Ht 5' (1.524 m)   Wt 196 lb (88.9 kg)   BMI 38.28 kg/m  General appearance: alert, cooperative, and appears stated age Head: Normocephalic, without obvious abnormality, atraumatic Neck: no adenopathy, supple, symmetrical, trachea midline, and thyroid not enlarged, symmetric, no tenderness/mass/nodules Lungs: clear to auscultation bilaterally Breasts: normal appearance, no masses or tenderness Heart: regular rate and rhythm, S1, S2 normal, no murmur, click, rub or gallop Abdomen: soft, non-tender; bowel sounds normal; no masses,  no organomegaly Pelvic: cervix normal in appearance, external genitalia normal, no adnexal masses or tenderness, no cervical motion tenderness, uterus normal size, shape, and consistency, and thick yellow foul-smelling discharge noted Extremities: extremities normal, atraumatic, no cyanosis or edema Pulses: 2+ and symmetric Skin:  multiple keloids, large skin tag on left posterior vulva Lymph nodes: Cervical, supraclavicular, and axillary nodes normal. Neurologic: Grossly normal    Assessment:    Healthy female exam.      Plan:   Problem List Items Addressed This Visit       Unprioritized   HTN (hypertension)    99214 - Poorly controlled, on meds---f/u PCP Stress reduction/self care, urged  mindfulness, yoga, meditation      Vaginal burning    99214 - check wet prep and treat accordingly      Relevant Orders   Cervicovaginal ancillary only( [place])   Other Visit Diagnoses     Screening for malignant neoplasm of cervix    -  Primary   Encounter for gynecological examination without abnormal finding       Relevant Orders   Cytology - PAP   Encounter for screening mammogram for malignant neoplasm of breast       Relevant Orders   MM 3D SCREENING MAMMOGRAM BILATERAL BREAST      Return in 1 year (on 09/05/2023).    See After Visit Summary for Counseling Recommendations

## 2022-09-06 LAB — CERVICOVAGINAL ANCILLARY ONLY
Bacterial Vaginitis (gardnerella): POSITIVE — AB
Candida Glabrata: NEGATIVE
Candida Vaginitis: NEGATIVE
Chlamydia: NEGATIVE
Comment: NEGATIVE
Comment: NEGATIVE
Comment: NEGATIVE
Comment: NEGATIVE
Comment: NEGATIVE
Comment: NORMAL
Neisseria Gonorrhea: NEGATIVE
Trichomonas: POSITIVE — AB

## 2022-09-06 MED ORDER — METRONIDAZOLE 500 MG PO TABS
500.0000 mg | ORAL_TABLET | Freq: Two times a day (BID) | ORAL | 0 refills | Status: AC
Start: 2022-09-06 — End: 2022-09-13

## 2022-09-06 NOTE — Addendum Note (Signed)
Addended by: Reva Bores on: 09/06/2022 05:48 PM   Modules accepted: Orders

## 2022-09-11 LAB — CYTOLOGY - PAP
Comment: NEGATIVE
Diagnosis: NEGATIVE
Diagnosis: REACTIVE
High risk HPV: NEGATIVE

## 2022-10-10 ENCOUNTER — Other Ambulatory Visit (HOSPITAL_COMMUNITY)
Admission: RE | Admit: 2022-10-10 | Discharge: 2022-10-10 | Disposition: A | Discharge status: Home / Self Care | Payer: No Typology Code available for payment source | Source: Ambulatory Visit | Attending: Family Medicine | Admitting: Family Medicine

## 2022-10-10 ENCOUNTER — Ambulatory Visit: Payer: No Typology Code available for payment source | Admitting: Family Medicine

## 2022-10-10 ENCOUNTER — Encounter: Payer: Self-pay | Admitting: Family Medicine

## 2022-10-10 VITALS — BP 159/102 | HR 67 | Wt 199.6 lb

## 2022-10-10 DIAGNOSIS — B9689 Other specified bacterial agents as the cause of diseases classified elsewhere: Secondary | ICD-10-CM

## 2022-10-10 DIAGNOSIS — A599 Trichomoniasis, unspecified: Secondary | ICD-10-CM | POA: Diagnosis present

## 2022-10-10 DIAGNOSIS — N760 Acute vaginitis: Secondary | ICD-10-CM | POA: Diagnosis not present

## 2022-10-10 DIAGNOSIS — B3731 Acute candidiasis of vulva and vagina: Secondary | ICD-10-CM

## 2022-10-10 NOTE — Progress Notes (Signed)
CC: Here for [facility] [place]  Doesn't note as much discharge as before

## 2022-10-10 NOTE — Progress Notes (Signed)
    Subjective:    Patient ID: Brittany Grant is a 52 y.o. female presenting with Follow-up  on 10/10/2022  HPI: Here today for f/u and TOC for trich. Previous trich treated 2 years ago. Partner treated once before. They have both now been treated x 7 days and they have refrained from intercourse during treatment.  She is still having some vaginal irritation.  Review of Systems  Constitutional:  Negative for chills and fever.  Respiratory:  Negative for shortness of breath.   Cardiovascular:  Negative for chest pain.  Gastrointestinal:  Negative for abdominal pain, nausea and vomiting.  Genitourinary:  Negative for dysuria.  Skin:  Negative for rash.      Objective:    BP (!) 141/96   Pulse 67   Wt 199 lb 9.6 oz (90.5 kg)   BMI 38.98 kg/m  Physical Exam Exam conducted with a chaperone present.  Constitutional:      General: She is not in acute distress.    Appearance: She is well-developed.  HENT:     Head: Normocephalic and atraumatic.  Eyes:     General: No scleral icterus. Cardiovascular:     Rate and Rhythm: Normal rate.  Pulmonary:     Effort: Pulmonary effort is normal.  Abdominal:     Palpations: Abdomen is soft.  Genitourinary:    Comments: BUS normal, vagina is pink and rugated, yellow discharge noted,  cervix is parous without lesion  Musculoskeletal:     Cervical back: Neck supple.  Skin:    General: Skin is warm and dry.     Findings: Lesion (large keloid on right buttock) present.  Neurological:     Mental Status: She is alert and oriented to person, place, and time.         Assessment & Plan:  Trichomoniasis - Here for TOC--sample sent, await results. If still +ve, consider tindamax and assume resistance. - Plan: Cervicovaginal ancillary only( [place])   Return if symptoms worsen or fail to improve.  Reva Bores, MD 10/10/2022 10:21 AM

## 2022-10-10 NOTE — Progress Notes (Signed)
RGYN patient here for F/U.  Last seen 06/06/22 +TRICH and BV.

## 2022-10-11 LAB — CERVICOVAGINAL ANCILLARY ONLY
Bacterial Vaginitis (gardnerella): POSITIVE — AB
Candida Glabrata: NEGATIVE
Candida Vaginitis: POSITIVE — AB
Chlamydia: NEGATIVE
Comment: NEGATIVE
Comment: NEGATIVE
Comment: NEGATIVE
Comment: NEGATIVE
Comment: NEGATIVE
Comment: NORMAL
Neisseria Gonorrhea: NEGATIVE
Trichomonas: NEGATIVE

## 2022-10-12 MED ORDER — METRONIDAZOLE 500 MG PO TABS
500.0000 mg | ORAL_TABLET | Freq: Two times a day (BID) | ORAL | 0 refills | Status: AC
Start: 2022-10-12 — End: 2022-10-19

## 2022-10-12 MED ORDER — FLUCONAZOLE 150 MG PO TABS
150.0000 mg | ORAL_TABLET | Freq: Every day | ORAL | 2 refills | Status: AC
Start: 2022-10-12 — End: ?

## 2022-10-12 NOTE — Addendum Note (Signed)
Addended by: Reva Bores on: 10/12/2022 08:26 AM   Modules accepted: Orders

## 2023-01-03 ENCOUNTER — Other Ambulatory Visit: Payer: Self-pay | Admitting: Family Medicine

## 2023-01-03 DIAGNOSIS — N760 Acute vaginitis: Secondary | ICD-10-CM
